# Patient Record
Sex: Female | Born: 1975 | Hispanic: No | Smoking: Never smoker
Health system: Southern US, Community
[De-identification: ages and names within clinical notes are randomized; demographics above are authoritative.]

## PROBLEM LIST (undated history)

## (undated) DIAGNOSIS — F909 Attention-deficit hyperactivity disorder, unspecified type: Secondary | ICD-10-CM

## (undated) DIAGNOSIS — F329 Major depressive disorder, single episode, unspecified: Secondary | ICD-10-CM

## (undated) DIAGNOSIS — R569 Unspecified convulsions: Secondary | ICD-10-CM

## (undated) DIAGNOSIS — F32A Depression, unspecified: Secondary | ICD-10-CM

## (undated) DIAGNOSIS — B719 Cestode infection, unspecified: Secondary | ICD-10-CM

## (undated) DIAGNOSIS — F419 Anxiety disorder, unspecified: Secondary | ICD-10-CM

## (undated) HISTORY — DX: Attention-deficit hyperactivity disorder, unspecified type: F90.9

## (undated) HISTORY — DX: Depression, unspecified: F32.A

## (undated) HISTORY — DX: Cestode infection, unspecified: B71.9

## (undated) HISTORY — PX: TONSILLECTOMY: SUR1361

## (undated) HISTORY — PX: OTHER SURGICAL HISTORY: SHX169

---
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
## 2003-05-31 ENCOUNTER — Other Ambulatory Visit: Admission: RE | Admit: 2003-05-31 | Discharge: 2003-05-31 | Payer: Self-pay | Admitting: Gynecology

## 2011-12-26 DIAGNOSIS — F909 Attention-deficit hyperactivity disorder, unspecified type: Secondary | ICD-10-CM | POA: Insufficient documentation

## 2011-12-26 DIAGNOSIS — F419 Anxiety disorder, unspecified: Secondary | ICD-10-CM | POA: Insufficient documentation

## 2013-07-27 ENCOUNTER — Encounter (HOSPITAL_COMMUNITY): Payer: Self-pay | Admitting: Emergency Medicine

## 2013-07-27 ENCOUNTER — Emergency Department (HOSPITAL_COMMUNITY): Payer: No Typology Code available for payment source

## 2013-07-27 ENCOUNTER — Emergency Department (HOSPITAL_COMMUNITY)
Admission: EM | Admit: 2013-07-27 | Discharge: 2013-07-27 | Disposition: A | Discharge status: Home / Self Care | Payer: No Typology Code available for payment source | Attending: Emergency Medicine | Admitting: Emergency Medicine

## 2013-07-27 DIAGNOSIS — F419 Anxiety disorder, unspecified: Secondary | ICD-10-CM

## 2013-07-27 DIAGNOSIS — F17200 Nicotine dependence, unspecified, uncomplicated: Secondary | ICD-10-CM | POA: Insufficient documentation

## 2013-07-27 DIAGNOSIS — R209 Unspecified disturbances of skin sensation: Secondary | ICD-10-CM | POA: Insufficient documentation

## 2013-07-27 DIAGNOSIS — Z8669 Personal history of other diseases of the nervous system and sense organs: Secondary | ICD-10-CM | POA: Insufficient documentation

## 2013-07-27 DIAGNOSIS — Z3202 Encounter for pregnancy test, result negative: Secondary | ICD-10-CM | POA: Insufficient documentation

## 2013-07-27 DIAGNOSIS — R202 Paresthesia of skin: Secondary | ICD-10-CM

## 2013-07-27 DIAGNOSIS — F411 Generalized anxiety disorder: Secondary | ICD-10-CM | POA: Insufficient documentation

## 2013-07-27 DIAGNOSIS — R109 Unspecified abdominal pain: Secondary | ICD-10-CM | POA: Insufficient documentation

## 2013-07-27 DIAGNOSIS — Z9889 Other specified postprocedural states: Secondary | ICD-10-CM | POA: Insufficient documentation

## 2013-07-27 DIAGNOSIS — R110 Nausea: Secondary | ICD-10-CM | POA: Insufficient documentation

## 2013-07-27 HISTORY — DX: Unspecified convulsions: R56.9

## 2013-07-27 LAB — COMPREHENSIVE METABOLIC PANEL
ALT: 14 U/L (ref 0–35)
BUN: 11 mg/dL (ref 6–23)
CO2: 23 mEq/L (ref 19–32)
Calcium: 9.3 mg/dL (ref 8.4–10.5)
Chloride: 104 mEq/L (ref 96–112)
Creatinine, Ser: 0.59 mg/dL (ref 0.50–1.10)
GFR calc Af Amer: >90 mL/min (ref >90)
GFR calc non Af Amer: >90 mL/min (ref >90)
Glucose, Bld: 109 mg/dL — ABNORMAL HIGH (ref 70–99)
Sodium: 140 mEq/L (ref 135–145)
Total Bilirubin: 0.5 mg/dL (ref 0.3–1.2)
Total Protein: 6.8 g/dL (ref 6.0–8.3)

## 2013-07-27 LAB — CBC WITH DIFFERENTIAL/PLATELET
Eosinophils Absolute: 0.1 10*3/uL (ref 0.0–0.7)
Eosinophils Relative: 1 % (ref 0–5)
HCT: 35.3 % — ABNORMAL LOW (ref 36.0–46.0)
Lymphocytes Relative: 35 % (ref 12–46)
Lymphs Abs: 2.1 10*3/uL (ref 0.7–4.0)
MCH: 31.7 pg (ref 26.0–34.0)
MCV: 90.3 fL (ref 78.0–100.0)
Monocytes Absolute: 0.5 10*3/uL (ref 0.1–1.0)
RBC: 3.91 MIL/uL (ref 3.87–5.11)
RDW: 12.2 % (ref 11.5–15.5)
WBC: 6.1 10*3/uL (ref 4.0–10.5)

## 2013-07-27 LAB — URINALYSIS, ROUTINE W REFLEX MICROSCOPIC
Bilirubin Urine: NEGATIVE
Hgb urine dipstick: NEGATIVE
Leukocytes, UA: NEGATIVE
Nitrite: NEGATIVE
Specific Gravity, Urine: 1.018 (ref 1.005–1.030)
Urobilinogen, UA: 0.2 mg/dL (ref 0.0–1.0)

## 2013-07-27 LAB — POCT I-STAT TROPONIN I: Troponin i, poc: 0 ng/mL (ref 0.00–0.08)

## 2013-07-27 LAB — POCT PREGNANCY, URINE: Preg Test, Ur: NEGATIVE

## 2013-07-27 MED ORDER — LORAZEPAM 2 MG/ML IJ SOLN
1.0000 mg | Freq: Once | INTRAMUSCULAR | Status: AC
  Administered 2013-07-27: 1 mg via INTRAVENOUS
  Filled 2013-07-27: qty 1

## 2013-07-27 NOTE — ED Notes (Signed)
Per EMS pt c/o left side abdominal pain/numbness as well as "respiratory issues". Per EMS pt reports swollen tongue and adenoids and thinks it's from black mold exposure. Pt is ambulatory and tearful on arrival.

## 2013-07-27 NOTE — ED Provider Notes (Signed)
CSN: [id number]     Arrival date & time 07/27/13  1537 History   First MD Initiated Contact with Patient 07/27/13 1541     Chief Complaint  Patient presents with  . multiple complaints    (Consider location/radiation/quality/duration/timing/severity/associated sxs/prior Treatment) HPI  A 37 year old female who presents with multiple complaints. Patient reports a 2 year history of shortness of breath, left-sided pain, joint pain, and eye pressure.  She also reports swollen tongue and adenoids. Her current complaint is anxiety and shortness of breath. Patient states that her chronic complaints have been related to "black mold in the house." She just recently moved. When asked what brought on her anxiety, the patient states "I was headed to the doctor it always makes me nervous." She is very tearful. Patient states that she has not taken her Xanax since Friday because she's been unable to get filled. Patient also reports having surgery at [facility] within the last year and a half when they found "a mass in the left side" and they couldn't figure out what was the cause.  Currently she endorses shortness of breath and whole body tingling/numbness. She denies headache, chest pain, urinary symptoms. Last menstrual period is current.  Past Medical History  Diagnosis Date  . Seizures     as child   Past Surgical History  Procedure Laterality Date  . Pelvic mass removal     History reviewed. No pertinent family history. History  Substance Use Topics  . Smoking status: Current Some Day Smoker    Types: Cigarettes  . Smokeless tobacco: Not on file  . Alcohol Use: Yes     Comment: occasionally   OB History   Grav Para Term Preterm Abortions TAB SAB Ect Mult Living                 Review of Systems  Constitutional: Negative for fever.  Respiratory: Positive for shortness of breath. Negative for cough and chest tightness.   Cardiovascular: Negative for chest pain.   Gastrointestinal: Positive for nausea. Negative for vomiting and abdominal pain.  Genitourinary: Positive for flank pain. Negative for dysuria.  Musculoskeletal: Negative for back pain.  Skin: Negative for wound.  Neurological: Positive for numbness. Negative for headaches.  Psychiatric/Behavioral: The patient is nervous/anxious.   All other systems reviewed and are negative.    Allergies  Lamisil  Home Medications   Current Outpatient Rx  Name  Route  Sig  Dispense  Refill  . ibuprofen (ADVIL,MOTRIN) 200 MG tablet   Oral   Take 800 mg by mouth every 6 (six) hours as needed for pain (PAIN).          BP 119/75  Pulse 90  Temp(Src) 98.6 F (37 C) (Oral)  Resp 16  SpO2 100%  LMP 07/27/2013 Physical Exam  Nursing note and vitals reviewed. Constitutional: She is oriented to person, place, and time. She appears well-developed and well-nourished.  Anxious and tearful  HENT:  Head: Normocephalic and atraumatic.  Eyes: Pupils are equal, round, and reactive to light.  Neck: Neck supple.  Cardiovascular: Normal rate, regular rhythm and normal heart sounds.   No murmur heard. Pulmonary/Chest: Effort normal and breath sounds normal. No respiratory distress. She has no wheezes.  Abdominal: Soft. Bowel sounds are normal. There is no tenderness. There is no rebound and no guarding.  Neurological: She is alert and oriented to person, place, and time. No cranial nerve deficit.  Normal strength in all 4 extremities, no dysmetria to finger-nose-finger  Skin: Skin is warm and dry.  Psychiatric: She has a normal mood and affect.    ED Course  Procedures (including critical care time) Labs Review Labs Reviewed  CBC WITH DIFFERENTIAL - Abnormal; Notable for the following:    HCT 35.3 (*)    All other components within normal limits  COMPREHENSIVE METABOLIC PANEL - Abnormal; Notable for the following:    Glucose, Bld 109 (*)    All other components within normal limits   URINALYSIS, ROUTINE W REFLEX MICROSCOPIC - Abnormal; Notable for the following:    APPearance CLOUDY (*)    pH 8.5 (*)    All other components within normal limits  POCT PREGNANCY, URINE  POCT I-STAT TROPONIN I   Imaging Review Dg Chest 2 View  07/27/2013   CLINICAL DATA:  Shortness of breath  EXAM: CHEST  2 VIEW  COMPARISON:  No comparison studies available.  FINDINGS: The heart size and mediastinal contours are within normal limits. Both lungs are clear. The visualized skeletal structures are unremarkable.  IMPRESSION: No active cardiopulmonary disease.   Electronically Signed   By: Kennith Center M.D.   On: 07/27/2013 17:39    EKG Interpretation   None      EKG independently reviewed by myself: Normal sinus rhythm with a rate of 78, no evidence of acute ST elevation or ischemia MDM   1. Anxiety   2. Tingling in extremities    This a 37 yo female who presents with multiple complaints.  Many of her complaints are chronic in nature including swollen tongue, joint pain and left-sided pain. The patient is very tearful and anxious on exam. She states "something is wrong with me."  Initial vital signs were within normal limits. Basic labwork including troponin are negative. EKG is nonischemic. Chest x-ray is negative. The patient is nonfocal on exam.  On reassessment, the patient continues to ruminate over her chronic symptoms. She is less anxious but states that I have Mold or Lyme disease.  She is also requesting that I test her for staph infection. The patient then proceeded to pull out multiple containers from her purse stating that these are the "worms I pulled out of my nose and skin."   I am unable to visualize any worms in the containers.    At this time the patient does not appear to have an emergent condition. She was treated for her anxiety and has improved. She now has her Xanax refilled. She was encouraged to followup with her primary care physician for referral to  specialist.  After history, exam, and medical workup I feel the patient has been appropriately medically screened and is safe for discharge home. Pertinent diagnoses were discussed with the patient. Patient was given return precautions.    Shon Baton, MD 07/28/13 [phone]

## 2013-07-29 ENCOUNTER — Institutional Professional Consult (permissible substitution): Payer: Self-pay | Admitting: Internal Medicine

## 2015-10-21 ENCOUNTER — Encounter (HOSPITAL_COMMUNITY): Payer: Self-pay | Admitting: Emergency Medicine

## 2015-10-21 ENCOUNTER — Emergency Department (HOSPITAL_COMMUNITY)
Admission: EM | Admit: 2015-10-21 | Discharge: 2015-10-21 | Disposition: A | Discharge status: Home / Self Care | Payer: No Typology Code available for payment source | Attending: Emergency Medicine | Admitting: Emergency Medicine

## 2015-10-21 DIAGNOSIS — Z87891 Personal history of nicotine dependence: Secondary | ICD-10-CM | POA: Diagnosis not present

## 2015-10-21 DIAGNOSIS — Z3202 Encounter for pregnancy test, result negative: Secondary | ICD-10-CM | POA: Insufficient documentation

## 2015-10-21 DIAGNOSIS — F419 Anxiety disorder, unspecified: Secondary | ICD-10-CM | POA: Diagnosis present

## 2015-10-21 HISTORY — DX: Anxiety disorder, unspecified: F41.9

## 2015-10-21 LAB — COMPREHENSIVE METABOLIC PANEL
ALBUMIN: 4.5 g/dL (ref 3.5–5.0)
ALK PHOS: 52 U/L (ref 38–126)
ALT: 12 U/L — ABNORMAL LOW (ref 14–54)
AST: 14 U/L — ABNORMAL LOW (ref 15–41)
Anion gap: 9 (ref 5–15)
BILIRUBIN TOTAL: 0.6 mg/dL (ref 0.3–1.2)
BUN: 11 mg/dL (ref 6–20)
CO2: 25 mmol/L (ref 22–32)
Calcium: 9.1 mg/dL (ref 8.9–10.3)
Chloride: 106 mmol/L (ref 101–111)
Creatinine, Ser: 0.55 mg/dL (ref 0.44–1.00)
GFR calc Af Amer: >60 mL/min (ref >60)
GFR calc non Af Amer: >60 mL/min (ref >60)
Glucose, Bld: 96 mg/dL (ref 65–99)
Potassium: 3.7 mmol/L (ref 3.5–5.1)
Sodium: 140 mmol/L (ref 135–145)
TOTAL PROTEIN: 7 g/dL (ref 6.5–8.1)

## 2015-10-21 LAB — SALICYLATE LEVEL: Salicylate Lvl: <4 mg/dL (ref 2.8–30.0)

## 2015-10-21 LAB — ETHANOL: Alcohol, Ethyl (B): <5 mg/dL (ref <5)

## 2015-10-21 LAB — CBC
HEMATOCRIT: 38.6 % (ref 36.0–46.0)
HEMOGLOBIN: 13.1 g/dL (ref 12.0–15.0)
MCH: 30.9 pg (ref 26.0–34.0)
MCHC: 33.9 g/dL (ref 30.0–36.0)
MCV: 91 fL (ref 78.0–100.0)
Platelets: 280 10*3/uL (ref 150–400)
RBC: 4.24 MIL/uL (ref 3.87–5.11)
RDW: 12.5 % (ref 11.5–15.5)
WBC: 5.9 10*3/uL (ref 4.0–10.5)

## 2015-10-21 LAB — ACETAMINOPHEN LEVEL: Acetaminophen (Tylenol), Serum: <10 ug/mL — ABNORMAL LOW (ref 10–30)

## 2015-10-21 LAB — POC URINE PREG, ED: Preg Test, Ur: NEGATIVE

## 2015-10-21 LAB — RAPID URINE DRUG SCREEN, HOSP PERFORMED
Amphetamines: NOT DETECTED
Barbiturates: NOT DETECTED
Benzodiazepines: POSITIVE — AB
COCAINE: NOT DETECTED
Opiates: NOT DETECTED
TETRAHYDROCANNABINOL: POSITIVE — AB

## 2015-10-21 MED ORDER — LORAZEPAM 1 MG PO TABS
1.0000 mg | ORAL_TABLET | Freq: Once | ORAL | Status: AC
  Administered 2015-10-21: 1 mg via ORAL
  Filled 2015-10-21: qty 1

## 2015-10-21 NOTE — BH Assessment (Signed)
Assessment completed. Consulted Teresa FessIjeoma Nwaeze, NP who agrees that pt does not meet inpatient criteria. Informed Dr. Rubin PayorPickering of the recommendation. Pt has requested to be discharged.

## 2015-10-21 NOTE — ED Notes (Addendum)
Pt. Requesting to be discharged. Since pt. Here voluntarily Dr. Rubin PayorPickering authorizing discharge.

## 2015-10-21 NOTE — Discharge Instructions (Signed)

## 2015-10-21 NOTE — ED Notes (Signed)
Pt. To [facility] from ED ambulatory without difficulty, to room 34 . [facility]. Pt. Is alert and oriented, warm and dry in no distress. Pt. Denies SI, HI, and AVH. Pt. Calm and cooperative. Pt. Made aware of security cameras and Q15 minute rounds. Pt. Encouraged to let Nursing staff know of any concerns or needs.

## 2015-10-21 NOTE — ED Notes (Addendum)
Pt transported from home with c/o anxiety, per EMS pt hyperventilating, out of valium, hx of anxiety. Per EMS pt recently had a friend die, feeling anxious d/t this.  Pt has multiple attempts to contact provider for refill, pt states she has T & A November 1st, has been having low temps, intermittent rash(none noted at this time), pt also c/o pain beside L eye.  Pt has multiple photos on telephone she would like to share with provider.

## 2015-10-21 NOTE — ED Notes (Signed)
Pt. Discharged in scrubs with discharge instructions and bagged belongings at her request.
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
## 2015-10-21 NOTE — ED Provider Notes (Signed)
CSN: [id number]     Arrival date & time 10/21/15  2039 History   First MD Initiated Contact with Patient 10/21/15 2116     Chief Complaint  Patient presents with  . Anxiety      Patient is a 40 y.o. female presenting with anxiety. The history is provided by the patient.  Anxiety Pertinent negatives include no chest pain, no abdominal pain and no shortness of breath.   patient presents with anxiety. She states that around a month ago she took her primary doctor that she wanted to switch from Xanax to Valium so it lasts longer. She states it is not working. She states she is not sure that they didn't give her a placebo. She states that also she has fullness in the left side of her face that centers her anxiety. States she's had her tonsils out and did not help. She states she's had a spot there for the last 2 years. She states her anxiety is been getting worse. She states that she needs to get started back on her Xanax) nurse will not tell the doctor to fill it. Patient states she needs to get her Xanax back or she doesn't know what she will do. She states she is not suicidal. Past Medical History  Diagnosis Date  . Seizures (HCC)     as child  . Anxiety    Past Surgical History  Procedure Laterality Date  . Pelvic mass removal    . Tonsillectomy     No family history on file. Social History  Substance Use Topics  . Smoking status: Former Smoker    Types: Cigarettes  . Smokeless tobacco: None  . Alcohol Use: Yes     Comment: occasionally   OB History    No data available     Review of Systems  Constitutional: Negative for appetite change.  Respiratory: Negative for shortness of breath.   Cardiovascular: Negative for chest pain.  Gastrointestinal: Negative for abdominal pain.  Genitourinary: Negative for dysuria.  Musculoskeletal: Negative for back pain.  Skin: Negative for wound.  Neurological: Negative for numbness.  Hematological: Negative for adenopathy.   Psychiatric/Behavioral: Negative for suicidal ideas.      Allergies  Lamisil  Home Medications   Prior to Admission medications   Medication Sig Start Date End Date Taking? Authorizing Provider  ALPRAZolam Prudy Feeler(XANAX) 0.5 MG tablet TK 1 T PO  BID 08/31/15  Yes Historical Provider, MD  ibuprofen (ADVIL,MOTRIN) 200 MG tablet Take 800 mg by mouth every 6 (six) hours as needed for pain (PAIN).    Historical Provider, MD   BP 123/77 mmHg  Pulse 63  Temp(Src) 98 F (36.7 C) (Oral)  Resp 18  Ht 5\' 6"  (1.676 m)  Wt 125 lb (56.7 kg)  BMI 20.19 kg/m2  SpO2 100%  LMP 10/14/2015 Physical Exam  Constitutional: She appears well-developed.  HENT:  Head: Atraumatic.  Eyes: EOM are normal.  Neck: Neck supple.  Cardiovascular: Normal rate.   Pulmonary/Chest: Effort normal.  Abdominal: Soft.  Musculoskeletal: Normal range of motion.  Neurological: She is alert.  Skin: Skin is warm.  Psychiatric:  Patient appears anxious.    ED Course  Procedures (including critical care time) Labs Review Labs Reviewed  COMPREHENSIVE METABOLIC PANEL - Abnormal; Notable for the following:    AST 14 (*)    ALT 12 (*)    All other components within normal limits  ACETAMINOPHEN LEVEL - Abnormal; Notable for the following:    Acetaminophen (Tylenol),  Serum <10 (*)    All other components within normal limits  URINE RAPID DRUG SCREEN, HOSP PERFORMED - Abnormal; Notable for the following:    Benzodiazepines POSITIVE (*)    Tetrahydrocannabinol POSITIVE (*)    All other components within normal limits  ETHANOL  SALICYLATE LEVEL  CBC  POC URINE PREG, ED    Imaging Review No results found. I have personally reviewed and evaluated these images and lab results as part of my medical decision-making.   EKG Interpretation None      MDM   Final diagnoses:  Anxiety    Patient presents with anxiety. She's been out of her Valium. Does appear to been started on a rather low-dose of Valium  But she  was not using it correctly and gave her more than the prescribed doses. I told her I would not refill the medicine since she should still have pills left over. She will need follow-up with primary doctor. She had told me that she was real anxious about it didn't know what she would do. She did not appear suicidal or homicidal. She received oral Ativan here. Appears medically cleared at that point she was no longer willing to stay and left. Do not know if she got the discharge instructions or not.   Benjiman Core, MD 10/22/15 0000

## 2016-10-22 ENCOUNTER — Ambulatory Visit (HOSPITAL_COMMUNITY): Payer: No Typology Code available for payment source | Admitting: Psychiatry

## 2016-11-19 ENCOUNTER — Ambulatory Visit (HOSPITAL_COMMUNITY): Payer: Self-pay | Admitting: Psychiatry

## 2016-11-22 ENCOUNTER — Encounter (INDEPENDENT_AMBULATORY_CARE_PROVIDER_SITE_OTHER): Payer: Self-pay

## 2016-11-22 ENCOUNTER — Encounter (HOSPITAL_COMMUNITY): Payer: Self-pay | Admitting: Psychiatry

## 2016-11-22 ENCOUNTER — Ambulatory Visit (INDEPENDENT_AMBULATORY_CARE_PROVIDER_SITE_OTHER): Payer: No Typology Code available for payment source | Admitting: Psychiatry

## 2016-11-22 VITALS — BP 122/74 | HR 72 | Ht 66.0 in | Wt 144.6 lb

## 2016-11-22 DIAGNOSIS — Z79899 Other long term (current) drug therapy: Secondary | ICD-10-CM

## 2016-11-22 DIAGNOSIS — Z818 Family history of other mental and behavioral disorders: Secondary | ICD-10-CM | POA: Diagnosis not present

## 2016-11-22 DIAGNOSIS — F419 Anxiety disorder, unspecified: Secondary | ICD-10-CM | POA: Diagnosis not present

## 2016-11-22 DIAGNOSIS — F4310 Post-traumatic stress disorder, unspecified: Secondary | ICD-10-CM

## 2016-11-22 DIAGNOSIS — F22 Delusional disorders: Secondary | ICD-10-CM

## 2016-11-22 NOTE — Patient Instructions (Signed)
RTC in 2 weeks
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
## 2016-12-06 ENCOUNTER — Ambulatory Visit (INDEPENDENT_AMBULATORY_CARE_PROVIDER_SITE_OTHER): Payer: No Typology Code available for payment source | Admitting: Psychiatry

## 2016-12-06 ENCOUNTER — Encounter (HOSPITAL_COMMUNITY): Payer: Self-pay | Admitting: Psychiatry

## 2016-12-06 VITALS — BP 118/68 | HR 81 | Ht 66.0 in | Wt 141.2 lb

## 2016-12-06 DIAGNOSIS — F410 Panic disorder [episodic paroxysmal anxiety] without agoraphobia: Secondary | ICD-10-CM | POA: Diagnosis not present

## 2016-12-06 DIAGNOSIS — F331 Major depressive disorder, recurrent, moderate: Secondary | ICD-10-CM

## 2016-12-06 DIAGNOSIS — Z79899 Other long term (current) drug therapy: Secondary | ICD-10-CM | POA: Diagnosis not present

## 2016-12-06 MED ORDER — ESCITALOPRAM OXALATE 10 MG PO TABS: 10.0000 mg | ORAL_TABLET | Freq: Every day | ORAL | 1 refills | Status: DC

## 2016-12-06 MED ORDER — ALPRAZOLAM 0.5 MG PO TABS: 0.5000 mg | ORAL_TABLET | Freq: Three times a day (TID) | ORAL | 0 refills | Status: DC | PRN

## 2016-12-06 NOTE — Progress Notes (Signed)
BH MD/PA/NP OP Progress Note  12/06/2016 1:50 PM Teresa Hensley  MRN:  [id number]  Chief Complaint:  Subjective:  Teresa Hensley presents today for follow-up of depression and anxiety symptoms. She continues to report ruminative symptoms with regard to her past medical issues. We spent time discussing how her rumination, and worries about future medical problems have affected her social life, and interpersonal relationships. She reports that she has been talking a lot with her mother about SSRI therapy. She reports that her mom recently started Lexapro, for her own struggles with anxiety and depression. She reports that mom has had a really good response to Lexapro, and this is made the patient more open to the idea of SSRI therapy.  I spent time learning about her day-to-day life, habits, and some of the behavioral modification she has been making to address her anxiety and mood symptoms. She continues to report that she has panic episodes throughout the day 2-3 times daily. Spent time discussing my recommendation that we start Lexapro 10 mg daily, and see if this provides her with a similar benefit as has been reported by her mother.  She denies any SI, and reports that her sleep has been okay. She agrees to start Lexapro, and we reviewed the risks and benefits of this therapy.  Visit Diagnosis:    ICD-9-CM ICD-10-CM   1. MDD (major depressive disorder), recurrent episode, moderate (HCC) 296.32 F33.1 escitalopram (LEXAPRO) 10 MG tablet     DISCONTINUED: escitalopram (LEXAPRO) 10 MG tablet    Past Psychiatric History: See intake H&P for full details. Reviewed, with no updates at this time.   Past Medical History:  Past Medical History:  Diagnosis Date  . Anxiety   . Seizures (HCC)    as child    Past Surgical History:  Procedure Laterality Date  . pelvic mass removal    . TONSILLECTOMY      Family Psychiatric History: See intake H&P for full details. Reviewed, with no updates at this  time.   Family History: History reviewed. No pertinent family history.  Social History:  Social History   Social History  . Marital status: Single    Spouse name: N/A  . Number of children: N/A  . Years of education: N/A   Social History Main Topics  . Smoking status: Never Smoker  . Smokeless tobacco: Never Used  . Alcohol use Yes     Comment: occasionally  . Drug use: No  . Sexual activity: Not Asked   Other Topics Concern  . None   Social History Narrative  . None    Allergies:  Allergies  Allergen Reactions  . Lamisil [Terbinafine]     " JUST CRAZINESS"     Metabolic Disorder Labs: No results found for: HGBA1C, MPG No results found for: PROLACTIN No results found for: CHOL, TRIG, HDL, CHOLHDL, VLDL, LDLCALC   Current Medications: Current Outpatient Prescriptions  Medication Sig Dispense Refill  . ALPRAZolam (XANAX) 0.5 MG tablet Take 1 tablet (0.5 mg total) by mouth 3 (three) times daily as needed for sleep or anxiety. 90 tablet 0  . escitalopram (LEXAPRO) 10 MG tablet Take 1 tablet (10 mg total) by mouth daily. 60 tablet 1  . ibuprofen (ADVIL,MOTRIN) 200 MG tablet Take 800 mg by mouth every 6 (six) hours as needed for pain (PAIN).     No current facility-administered medications for this visit.     Neurologic: Headache: Negative Seizure: Negative Paresthesias: Negative  Musculoskeletal: Strength & Muscle Tone: within  normal limits Gait & Station: normal Patient leans: N/A  Psychiatric Specialty Exam: ROS  Blood pressure 118/68, pulse 81, height 5\' 6"  (1.676 m), weight 64 kg (141 lb 3.2 oz).Body mass index is 22.79 kg/m.  General Appearance: Well Groomed  Eye Contact:  Fair  Speech:  Clear and Coherent  Volume:  Normal  Mood:  Depressed  Affect:  Tearful  Thought Process:  Coherent  Orientation:  Full (Time, Place, and Person)  Thought Content: Logical   Suicidal Thoughts:  No  Homicidal Thoughts:  No  Memory:  Immediate;   Good   Judgement:  Fair  Insight:  Fair  Psychomotor Activity:  Normal  Concentration:  Concentration: Good and Attention Span: Good  Recall:  NA  Fund of Knowledge: Good  Language: Good  Akathisia:  Negative  Handed:  Right  AIMS (if indicated):  n/a  Assets:  Communication Skills Desire for Improvement Financial Resources/Insurance Housing Intimacy Leisure Time Physical Health Resilience Social Support Talents/Skills Transportation Vocational/Educational  ADL's:  Intact  Cognition: WNL  Sleep:  6-7 hours nightly    Treatment Plan Summary:  Teresa PassyWendy Hensley is a 41 year old female with a history of parasitosis of unclear etiology, with general remission of her medical symptoms in the past 6 months to one year. Her medical issues have contributed to significant depressive and anxiety symptoms, and she has become preoccupied with the distress that his contributed to her personal and social life. She is also been contending with the loss of a friend, who ended up committing suicide a few years ago. She presents with tearfulness, down and depressed mood, sense of guilt and worthlessness, and anhedonia. Her mother has had a good response to Lexapro, and the patient is willing to give this medication a trial. We'll continue as below and follow up in 1 month.  # MDD with anxious features, panic attacks - Lexapro 10 mg daily for 1 week, then increase to 20 mg daily - Xanax 0.5 mg TID, goal to taper and discontinue as SSRI takes effect - RTC 1 month   Burnard LeighAlexander Arya Leler Brion, MD 12/06/2016, 1:50 PM

## 2016-12-26 ENCOUNTER — Other Ambulatory Visit (HOSPITAL_COMMUNITY): Payer: Self-pay

## 2016-12-26 DIAGNOSIS — F331 Major depressive disorder, recurrent, moderate: Secondary | ICD-10-CM

## 2016-12-26 MED ORDER — ESCITALOPRAM OXALATE 20 MG PO TABS: 20.0000 mg | ORAL_TABLET | Freq: Every day | ORAL | 1 refills | Status: DC

## 2016-12-26 NOTE — Progress Notes (Signed)
Excellent, thank you for the update.

## 2016-12-26 NOTE — Progress Notes (Unsigned)
Patient called, she had started on Lexapro at her last visit. She was started on 10 mg and then increased to 20 mg. The prescription was written for 1 a day # 60 with a refill. The pharmacy only gave the patient 30 tablets (they should have called me for clarification...) and they told her it was to early for another refill, so I sent in a new rx for 20 mg 1 po qd # 30 x1.

## 2017-01-10 ENCOUNTER — Ambulatory Visit (HOSPITAL_COMMUNITY): Payer: Self-pay | Admitting: Psychiatry

## 2017-01-10 ENCOUNTER — Ambulatory Visit (INDEPENDENT_AMBULATORY_CARE_PROVIDER_SITE_OTHER): Payer: No Typology Code available for payment source | Admitting: Psychiatry

## 2017-01-10 VITALS — BP 116/76 | HR 128 | Ht 66.0 in | Wt 135.0 lb

## 2017-01-10 DIAGNOSIS — F22 Delusional disorders: Secondary | ICD-10-CM

## 2017-01-10 DIAGNOSIS — F419 Anxiety disorder, unspecified: Secondary | ICD-10-CM | POA: Diagnosis not present

## 2017-01-10 DIAGNOSIS — F4310 Post-traumatic stress disorder, unspecified: Secondary | ICD-10-CM

## 2017-01-10 DIAGNOSIS — Z79899 Other long term (current) drug therapy: Secondary | ICD-10-CM

## 2017-01-10 DIAGNOSIS — F331 Major depressive disorder, recurrent, moderate: Secondary | ICD-10-CM | POA: Diagnosis not present

## 2017-01-10 MED ORDER — ALPRAZOLAM 0.5 MG PO TABS: 0.5000 mg | ORAL_TABLET | Freq: Three times a day (TID) | ORAL | 0 refills | Status: DC | PRN

## 2017-01-10 MED ORDER — SERTRALINE HCL 50 MG PO TABS: 50.0000 mg | ORAL_TABLET | Freq: Every day | ORAL | 2 refills | Status: DC
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
## 2017-01-10 NOTE — Patient Instructions (Signed)
STOP lexapro  START Zoloft 25 mg for 1 week, then increase to 50 mg  Xanax 3 times daily prn

## 2017-02-11 ENCOUNTER — Other Ambulatory Visit (HOSPITAL_COMMUNITY): Payer: Self-pay

## 2017-02-11 NOTE — Telephone Encounter (Signed)
Medication management - Telephone call with pt. to follow up on message she left with request for a refill of her prescribed Xanax and requests this order be called into [facility] on [address] in [place].  Informed Dr. Rene KocherEksir is off this week and request will be sent to Dr. Ladona Ridgelaylor who will be in the office on 02/12/17 to see if he will provide the refill order and reminded patient of next evaluation set for 02/25/17 with Dr. Rene KocherEksir.

## 2017-02-12 ENCOUNTER — Other Ambulatory Visit (HOSPITAL_COMMUNITY): Payer: Self-pay

## 2017-02-12 DIAGNOSIS — F4310 Post-traumatic stress disorder, unspecified: Secondary | ICD-10-CM

## 2017-02-12 DIAGNOSIS — F331 Major depressive disorder, recurrent, moderate: Secondary | ICD-10-CM

## 2017-02-12 DIAGNOSIS — F419 Anxiety disorder, unspecified: Secondary | ICD-10-CM

## 2017-02-12 MED ORDER — ALPRAZOLAM 0.5 MG PO TABS: 0.5000 mg | ORAL_TABLET | Freq: Three times a day (TID) | ORAL | 0 refills | Status: DC | PRN

## 2017-02-12 NOTE — Telephone Encounter (Signed)
Spoke with Dr. Ladona Ridgelaylor and per protocol a 14 day prescription was sent to pharmacy - this will get patient to her appointment on 5/21. Patient is aware.

## 2017-02-25 ENCOUNTER — Ambulatory Visit (HOSPITAL_COMMUNITY): Payer: Self-pay | Admitting: Psychiatry

## 2017-03-01 ENCOUNTER — Telehealth (HOSPITAL_COMMUNITY): Payer: Self-pay

## 2017-03-01 DIAGNOSIS — F4310 Post-traumatic stress disorder, unspecified: Secondary | ICD-10-CM

## 2017-03-01 DIAGNOSIS — F331 Major depressive disorder, recurrent, moderate: Secondary | ICD-10-CM

## 2017-03-01 DIAGNOSIS — F419 Anxiety disorder, unspecified: Secondary | ICD-10-CM

## 2017-03-01 NOTE — Telephone Encounter (Signed)
Patient called and said that she had to be rescheduled and will not have enough Xanax until her appointment, okay to fill? Please review and advise, thank you

## 2017-03-01 NOTE — Telephone Encounter (Signed)
Looks like Dr. Ladona Ridgelaylor sent in 45 tablets on 5/8.  We can send in a refill of 60 tablets (0.5 mg BID prn) and she should be gradually decreasing her use of xanax, as she and I have discussed multiple times, so we can emphasize antidepressant medications instead

## 2017-03-05 MED ORDER — ALPRAZOLAM 0.5 MG PO TABS: 0.5000 mg | ORAL_TABLET | Freq: Two times a day (BID) | ORAL | 0 refills | Status: AC | PRN

## 2017-03-05 NOTE — Telephone Encounter (Signed)
Called in the 60 tablets, I called patient and left her a voicemail letting her know

## 2017-03-07 ENCOUNTER — Telehealth (HOSPITAL_COMMUNITY): Payer: Self-pay

## 2017-03-07 DIAGNOSIS — F331 Major depressive disorder, recurrent, moderate: Secondary | ICD-10-CM

## 2017-03-07 DIAGNOSIS — F419 Anxiety disorder, unspecified: Secondary | ICD-10-CM

## 2017-03-07 DIAGNOSIS — F4310 Post-traumatic stress disorder, unspecified: Secondary | ICD-10-CM

## 2017-03-07 NOTE — Telephone Encounter (Signed)
Medication refill request - [facility] is requesting a new order for 90 day supply of sertraline in place of current 30 day orders.

## 2017-03-08 NOTE — Telephone Encounter (Signed)
Oh yes that is fine

## 2017-03-11 MED ORDER — SERTRALINE HCL 50 MG PO TABS: 50.0000 mg | ORAL_TABLET | Freq: Every day | ORAL | 0 refills | Status: DC

## 2017-03-11 NOTE — Telephone Encounter (Signed)
New 90 day order for patient's prescribed Sertraline e-scribed to [facility]'s [facility], [phone]#[id number] as verbally approved by Dr. Rene KocherEksir.

## 2017-03-19 ENCOUNTER — Ambulatory Visit (HOSPITAL_COMMUNITY): Payer: Self-pay | Admitting: Psychiatry

## 2018-08-07 ENCOUNTER — Encounter (HOSPITAL_COMMUNITY): Payer: Self-pay | Admitting: Psychiatry

## 2018-08-07 ENCOUNTER — Ambulatory Visit (INDEPENDENT_AMBULATORY_CARE_PROVIDER_SITE_OTHER): Payer: No Typology Code available for payment source | Admitting: Psychiatry

## 2018-08-07 ENCOUNTER — Encounter

## 2018-08-07 VITALS — BP 127/96 | HR 105 | Ht 66.0 in | Wt 140.0 lb

## 2018-08-07 DIAGNOSIS — F331 Major depressive disorder, recurrent, moderate: Secondary | ICD-10-CM | POA: Diagnosis not present

## 2018-08-07 DIAGNOSIS — F411 Generalized anxiety disorder: Secondary | ICD-10-CM | POA: Diagnosis not present

## 2018-08-07 MED ORDER — ALPRAZOLAM 0.5 MG PO TABS: 0.5000 mg | ORAL_TABLET | Freq: Every evening | ORAL | 1 refills | Status: DC | PRN

## 2018-08-07 MED ORDER — ESCITALOPRAM OXALATE 10 MG PO TABS: 10.0000 mg | ORAL_TABLET | Freq: Every day | ORAL | 1 refills | Status: DC

## 2018-08-07 NOTE — Progress Notes (Signed)
BH MD/PA/NP OP Progress Note  10/18/2018 1:23 PM Teresa Hensley  MRN:  [id number]  Chief Complaint:  Chief Complaint    Anxiety; Depression     Subjective: I am meeting the patient for the first time today.  She was previously treated by Dr. Rene Kocher on last seen in April 2018. She saw Dr. Rene Kocher 3 times.   Patient presents today with her mother.  Per record review I see that she has history of anxiety and childhood seizures.  Today she tells me that she is very anxious and depressed.  She has been suffering from mold and parasitic infections all over her body with various symptoms for the last 5 years at least.  She has thought treatment from multiple doctors and has moved at least 3-4 times over the last few years believing that her apartment had mold which was causing her symptoms.  Due to this ongoing anxiety in somatic complaints she has stopped working and lost many friends.  Because of her ongoing health problems she is having extreme anxiety.  She talks of poor sleep, ruminations and racing thoughts, fear over her future and health.  Every physical issue becomes extreme.  She has been taking Xanax and states that it does help to control some of her extreme anxiety.  This is leading to her depression as she feels like she is not getting any help and that no one understands.  She has low motivation and low energy and anhedonia.  She denies SI/HI.  She denies auditory and visual hallucinations and ideas of reference.  She does tell me that she was diagnosed with ADHD after college and was treated with Adderall.  She believes that that would be helpful.  She tells me that she has tried many different antidepressants in the past and has never had a good experience with them.  At this point she is hesitant to try anything except maybe Lexapro.          Visit Diagnosis:    ICD-10-CM   1. GAD (generalized anxiety disorder) F41.1   2. MDD (major depressive disorder), recurrent episode, moderate  (HCC) F33.1        Past Psychiatric History: Per record review and patient history Patient has depression and anxiety symptoms related to her physical illnesses. ADHD dx in childhood- Adderall XR very effective, Vyvanse- not effective. Lexapro ineffective and weight gain.  Patient has been to numerous outpatient psychiatrist and primary care doctors for mental health treatment.  She denies any history of inpatient psychiatric admissions.  She is tried numerous medications and is generally hesitant to make any changes.  Past Medical History:  Past Medical History:  Diagnosis Date  . ADHD (attention deficit hyperactivity disorder)   . Anxiety   . Seizures (HCC)    as child    Past Surgical History:  Procedure Laterality Date  . pelvic mass removal    . TONSILLECTOMY      Family Psychiatric History: Patient reports a family history of OCD   Family History: No family history on file.  Social History:  Social History   Socioeconomic History  . Marital status: Single    Spouse name: Not on file  . Number of children: 0  . Years of education: Not on file  . Highest education level: Bachelor's degree (e.g., BA, AB, BS)  Occupational History  . Not on file  Social Needs  . Financial resource strain: Not hard at all  . Food insecurity:    Worry:  Never true    Inability: Never true  . Transportation needs:    Medical: No    Non-medical: No  Tobacco Use  . Smoking status: Never Smoker  . Smokeless tobacco: Never Used  Substance and Sexual Activity  . Alcohol use: Yes    Comment: occasionally  . Drug use: No  . Sexual activity: Not Currently  Lifestyle  . Physical activity:    Days per week: 5 days    Minutes per session: 60 min  . Stress: Rather much  Relationships  . Social connections:    Talks on phone: More than three times a week    Gets together: More than three times a week    Attends religious service: Not on file    Active member of club or organization:  No    Attends meetings of clubs or organizations: Never    Relationship status: Never married  Other Topics Concern  . Not on file  Social History Narrative  . Not on file    Allergies:  Allergies  Allergen Reactions  . Tramadol Itching  . Codeine Itching and Nausea Only  . Lamisil [Terbinafine]     " JUST CRAZINESS"     Metabolic Disorder Labs: Lab Results  Component Value Date   HGBA1C 4.3 (L) 09/10/2018   MPG 76.71 09/10/2018   No results found for: PROLACTIN No results found for: CHOL, TRIG, HDL, CHOLHDL, VLDL, LDLCALC   Current Medications: Current Outpatient Medications  Medication Sig Dispense Refill  . ADDERALL XR 15 MG 24 hr capsule Take 1 capsule by mouth daily. 30 capsule 0  . ALPRAZolam (XANAX) 0.5 MG tablet Take 1 tablet (0.5 mg total) by mouth at bedtime as needed for anxiety. 30 tablet 1  . amphetamine-dextroamphetamine (ADDERALL XR) 15 MG 24 hr capsule Take 1 capsule by mouth daily. 30 capsule 0  . famotidine (PEPCID) 40 MG tablet Take 1 tablet (40 mg total) by mouth 2 (two) times daily. 30 tablet 0  . pantoprazole (PROTONIX) 40 MG tablet Take 1 tablet (40 mg total) by mouth daily. 30 tablet 0   No current facility-administered medications for this visit.      Musculoskeletal: Strength & Muscle Tone: within normal limits Gait & Station: normal Patient leans: N/A  Psychiatric Specialty Exam: Review of Systems  Constitutional: Negative for chills, diaphoresis and fever.  HENT: Negative for congestion, nosebleeds, sinus pain and sore throat.     Blood pressure (!) 127/96, pulse (!) 105, height 5\' 6"  (1.676 m), weight 140 lb (63.5 kg), SpO2 100 %.Body mass index is 22.6 kg/m.  General Appearance: Fairly Groomed  Eye Contact:  Good  Speech:  pushed  Volume:  Normal  Mood:  Anxious and Depressed  Affect:  Labile and Tearful  Thought Process:  Coherent and Descriptions of Associations: Circumstantial  Orientation:  Full (Time, Place, and Person)   Thought Content:  Rumination  Suicidal Thoughts:  No  Homicidal Thoughts:  No  Memory:  Immediate;   Good  Judgement:  Poor  Insight:  Shallow  Psychomotor Activity:  Normal  Concentration:  Concentration: Poor  Recall:  Good  Fund of Knowledge:  Good  Language:  Good  Akathisia:  No  Handed:  Right  AIMS (if indicated):     Assets:  Desire for Improvement Social Support Talents/Skills Transportation  ADL's:  Intact  Cognition:  WNL  Sleep:   poor     1. GAD (generalized anxiety disorder)   2. MDD (major depressive  disorder), recurrent episode, moderate (HCC)    - Xanax 0.5 mg TID, goal to taper and discontinue as SSRI takes effect      Assessment: GAD; MDD-recurrent, moderate; r/o ADHD  I reviewed the medical records   Medication management with supportive therapy. Risks and benefits, side effects and alternative treatment options discussed with patient. Pt was given an opportunity to ask questions about medication, illness, and treatment. All current psychiatric medications have been reviewed and discussed with the patient and adjusted as clinically appropriate. The patient has been provided an accurate and updated list of the medications being now prescribed. Pt verbalized understanding and verbal consent obtained for treatment.  The risk of un-intended pregnancy is high based on the fact that pt reports she is not using any conceptive. Pt is aware that these meds carry a teratogenic risk. Pt will discuss plan of action if she does or plans to become pregnant in the future.  Status of current problems: anxiety and depression are severe  Meds: d/c Zoloft Lexapro 10mg  po qD (started one month ago by PCP) Xanax 0.5mg  qD prn   Labs: none  Therapy: brief supportive therapy provided. Discussed psychosocial stressors in detail.      Consultations: Referred for therapy Encouraged to follow up with PCP as needed    SI and is at an acute low risk for suicide.  Patient told to call clinic if any problems occur. Patient advised to go to ER if they should develop SI/HI, side effects, or if symptoms worsen. Pt has crisis numbers to call if needed. Pt acknowledged and agreed with plan and verbalized understanding.  F/up in 1 months or sooner if needed  The duration of this appointment visit was 45 minutes of face-to-face time with the patient.  Greater than 50% of this time was spent in counseling, explanation of  diagnosis, planning of further management, and coordination of care    Oletta Darter, MD 10/18/2018, 1:23 PM

## 2018-09-10 ENCOUNTER — Emergency Department (HOSPITAL_COMMUNITY): Payer: No Typology Code available for payment source

## 2018-09-10 ENCOUNTER — Encounter (HOSPITAL_COMMUNITY): Payer: Self-pay | Admitting: *Deleted

## 2018-09-10 ENCOUNTER — Inpatient Hospital Stay (HOSPITAL_COMMUNITY)
Admission: EM | Admit: 2018-09-10 | Discharge: 2018-09-11 | DRG: 641 | Disposition: A | Discharge status: Home / Self Care | Payer: No Typology Code available for payment source | Attending: Family Medicine | Admitting: Family Medicine

## 2018-09-10 ENCOUNTER — Inpatient Hospital Stay (HOSPITAL_COMMUNITY): Payer: No Typology Code available for payment source

## 2018-09-10 ENCOUNTER — Other Ambulatory Visit (HOSPITAL_COMMUNITY): Payer: Self-pay

## 2018-09-10 ENCOUNTER — Other Ambulatory Visit: Payer: Self-pay

## 2018-09-10 DIAGNOSIS — F419 Anxiety disorder, unspecified: Secondary | ICD-10-CM | POA: Diagnosis present

## 2018-09-10 DIAGNOSIS — E8729 Other acidosis: Secondary | ICD-10-CM | POA: Diagnosis present

## 2018-09-10 DIAGNOSIS — R112 Nausea with vomiting, unspecified: Secondary | ICD-10-CM

## 2018-09-10 DIAGNOSIS — Z79899 Other long term (current) drug therapy: Secondary | ICD-10-CM | POA: Diagnosis not present

## 2018-09-10 DIAGNOSIS — T730XXA Starvation, initial encounter: Secondary | ICD-10-CM | POA: Diagnosis present

## 2018-09-10 DIAGNOSIS — Z888 Allergy status to other drugs, medicaments and biological substances status: Secondary | ICD-10-CM

## 2018-09-10 DIAGNOSIS — E872 Acidosis, unspecified: Secondary | ICD-10-CM | POA: Diagnosis present

## 2018-09-10 DIAGNOSIS — F329 Major depressive disorder, single episode, unspecified: Secondary | ICD-10-CM | POA: Diagnosis present

## 2018-09-10 DIAGNOSIS — N179 Acute kidney failure, unspecified: Secondary | ICD-10-CM

## 2018-09-10 DIAGNOSIS — F909 Attention-deficit hyperactivity disorder, unspecified type: Secondary | ICD-10-CM | POA: Diagnosis present

## 2018-09-10 DIAGNOSIS — R748 Abnormal levels of other serum enzymes: Secondary | ICD-10-CM

## 2018-09-10 DIAGNOSIS — Z885 Allergy status to narcotic agent status: Secondary | ICD-10-CM

## 2018-09-10 DIAGNOSIS — R1013 Epigastric pain: Secondary | ICD-10-CM

## 2018-09-10 DIAGNOSIS — G4700 Insomnia, unspecified: Secondary | ICD-10-CM | POA: Diagnosis present

## 2018-09-10 DIAGNOSIS — F99 Mental disorder, not otherwise specified: Secondary | ICD-10-CM

## 2018-09-10 DIAGNOSIS — R7989 Other specified abnormal findings of blood chemistry: Secondary | ICD-10-CM

## 2018-09-10 DIAGNOSIS — R945 Abnormal results of liver function studies: Secondary | ICD-10-CM

## 2018-09-10 LAB — COMPREHENSIVE METABOLIC PANEL
ALBUMIN: 4.8 g/dL (ref 3.5–5.0)
ALK PHOS: 54 U/L (ref 38–126)
ALT: 56 U/L — ABNORMAL HIGH (ref 0–44)
AST: 38 U/L (ref 15–41)
BILIRUBIN TOTAL: 1.6 mg/dL — AB (ref 0.3–1.2)
BUN: 9 mg/dL (ref 6–20)
CALCIUM: 8.7 mg/dL — AB (ref 8.9–10.3)
CO2: <7 mmol/L — ABNORMAL LOW (ref 22–32)
CREATININE: 1.57 mg/dL — AB (ref 0.44–1.00)
Chloride: 104 mmol/L (ref 98–111)
GFR calc Af Amer: 47 mL/min — ABNORMAL LOW (ref >60)
GFR calc non Af Amer: 41 mL/min — ABNORMAL LOW (ref >60)
GLUCOSE: 244 mg/dL — AB (ref 70–99)
Potassium: 5.2 mmol/L — ABNORMAL HIGH (ref 3.5–5.1)
Sodium: 137 mmol/L (ref 135–145)
TOTAL PROTEIN: 7.9 g/dL (ref 6.5–8.1)

## 2018-09-10 LAB — BASIC METABOLIC PANEL
Anion gap: 21 — ABNORMAL HIGH (ref 5–15)
Anion gap: 15 (ref 5–15)
Anion gap: 13 (ref 5–15)
Anion gap: 11 (ref 5–15)
BUN: 8 mg/dL (ref 6–20)
BUN: 9 mg/dL (ref 6–20)
BUN: 5 mg/dL — ABNORMAL LOW (ref 6–20)
BUN: <5 mg/dL — ABNORMAL LOW (ref 6–20)
CALCIUM: 8.7 mg/dL — AB (ref 8.9–10.3)
CO2: 8 mmol/L — ABNORMAL LOW (ref 22–32)
CO2: 10 mmol/L — ABNORMAL LOW (ref 22–32)
CO2: 18 mmol/L — AB (ref 22–32)
CO2: 22 mmol/L (ref 22–32)
CREATININE: 1.34 mg/dL — AB (ref 0.44–1.00)
Calcium: 8.4 mg/dL — ABNORMAL LOW (ref 8.9–10.3)
Calcium: 8.5 mg/dL — ABNORMAL LOW (ref 8.9–10.3)
Calcium: 8.4 mg/dL — ABNORMAL LOW (ref 8.9–10.3)
Chloride: 108 mmol/L (ref 98–111)
Chloride: 110 mmol/L (ref 98–111)
Chloride: 105 mmol/L (ref 98–111)
Chloride: 104 mmol/L (ref 98–111)
Creatinine, Ser: 1.1 mg/dL — ABNORMAL HIGH (ref 0.44–1.00)
Creatinine, Ser: 0.95 mg/dL (ref 0.44–1.00)
Creatinine, Ser: 0.89 mg/dL (ref 0.44–1.00)
GFR calc Af Amer: 57 mL/min — ABNORMAL LOW (ref >60)
GFR calc Af Amer: >60 mL/min (ref >60)
GFR calc Af Amer: >60 mL/min (ref >60)
GFR calc Af Amer: >60 mL/min (ref >60)
GFR calc non Af Amer: >60 mL/min (ref >60)
GFR calc non Af Amer: >60 mL/min (ref >60)
GFR calc non Af Amer: >60 mL/min (ref >60)
GFR, EST NON AFRICAN AMERICAN: 49 mL/min — AB (ref >60)
Glucose, Bld: 214 mg/dL — ABNORMAL HIGH (ref 70–99)
Glucose, Bld: 155 mg/dL — ABNORMAL HIGH (ref 70–99)
Glucose, Bld: 172 mg/dL — ABNORMAL HIGH (ref 70–99)
Glucose, Bld: 138 mg/dL — ABNORMAL HIGH (ref 70–99)
Potassium: 5.9 mmol/L — ABNORMAL HIGH (ref 3.5–5.1)
Potassium: 4.7 mmol/L (ref 3.5–5.1)
Potassium: 3.6 mmol/L (ref 3.5–5.1)
Potassium: 3 mmol/L — ABNORMAL LOW (ref 3.5–5.1)
SODIUM: 137 mmol/L (ref 135–145)
Sodium: 137 mmol/L (ref 135–145)
Sodium: 135 mmol/L (ref 135–145)
Sodium: 136 mmol/L (ref 135–145)

## 2018-09-10 LAB — CBG MONITORING, ED
GLUCOSE-CAPILLARY: 136 mg/dL — AB (ref 70–99)
Glucose-Capillary: 223 mg/dL — ABNORMAL HIGH (ref 70–99)
Glucose-Capillary: 150 mg/dL — ABNORMAL HIGH (ref 70–99)
Glucose-Capillary: 157 mg/dL — ABNORMAL HIGH (ref 70–99)
Glucose-Capillary: 157 mg/dL — ABNORMAL HIGH (ref 70–99)
Glucose-Capillary: 146 mg/dL — ABNORMAL HIGH (ref 70–99)

## 2018-09-10 LAB — I-STAT BETA HCG BLOOD, ED (MC, WL, AP ONLY): I-stat hCG, quantitative: <5 m[IU]/mL (ref <5)

## 2018-09-10 LAB — URINALYSIS, ROUTINE W REFLEX MICROSCOPIC
Bacteria, UA: NONE SEEN
Bilirubin Urine: NEGATIVE
Glucose, UA: 150 mg/dL — AB
Ketones, ur: 80 mg/dL — AB
Leukocytes, UA: NEGATIVE
Nitrite: NEGATIVE
PH: 5 (ref 5.0–8.0)
Protein, ur: 100 mg/dL — AB
SPECIFIC GRAVITY, URINE: 1.015 (ref 1.005–1.030)

## 2018-09-10 LAB — I-STAT VENOUS BLOOD GAS, ED
Acid-base deficit: 21 mmol/L — ABNORMAL HIGH (ref 0.0–2.0)
Bicarbonate: 6.6 mmol/L — ABNORMAL LOW (ref 20.0–28.0)
O2 Saturation: 63 %
PCO2 VEN: 20.6 mmHg — AB (ref 44.0–60.0)
TCO2: 7 mmol/L — ABNORMAL LOW (ref 22–32)
pH, Ven: 7.115 — CL (ref 7.250–7.430)
pO2, Ven: 42 mmHg (ref 32.0–45.0)

## 2018-09-10 LAB — INFLUENZA PANEL BY PCR (TYPE A & B)
Influenza A By PCR: NEGATIVE
Influenza B By PCR: NEGATIVE

## 2018-09-10 LAB — LIPASE, BLOOD: Lipase: 185 U/L — ABNORMAL HIGH (ref 11–51)

## 2018-09-10 LAB — CBC
HCT: 46.7 % — ABNORMAL HIGH (ref 36.0–46.0)
Hemoglobin: 14 g/dL (ref 12.0–15.0)
MCH: 30 pg (ref 26.0–34.0)
MCHC: 30 g/dL (ref 30.0–36.0)
MCV: 100 fL (ref 80.0–100.0)
PLATELETS: 351 10*3/uL (ref 150–400)
RBC: 4.67 MIL/uL (ref 3.87–5.11)
RDW: 12.7 % (ref 11.5–15.5)
WBC: 13.2 10*3/uL — ABNORMAL HIGH (ref 4.0–10.5)
nRBC: 0 % (ref 0.0–0.2)

## 2018-09-10 LAB — GLUCOSE, CAPILLARY
GLUCOSE-CAPILLARY: 138 mg/dL — AB (ref 70–99)
Glucose-Capillary: 107 mg/dL — ABNORMAL HIGH (ref 70–99)
Glucose-Capillary: 132 mg/dL — ABNORMAL HIGH (ref 70–99)
Glucose-Capillary: 144 mg/dL — ABNORMAL HIGH (ref 70–99)

## 2018-09-10 LAB — HEMOGLOBIN A1C
HEMOGLOBIN A1C: 4.3 % — AB (ref 4.8–5.6)
Mean Plasma Glucose: 76.71 mg/dL

## 2018-09-10 LAB — ETHANOL

## 2018-09-10 LAB — RAPID URINE DRUG SCREEN, HOSP PERFORMED
Amphetamines: NOT DETECTED
Barbiturates: NOT DETECTED
Benzodiazepines: POSITIVE — AB
Cocaine: NOT DETECTED
Opiates: NOT DETECTED
Tetrahydrocannabinol: NOT DETECTED

## 2018-09-10 LAB — BETA-HYDROXYBUTYRIC ACID
Beta-Hydroxybutyric Acid: 3 mmol/L — ABNORMAL HIGH (ref 0.05–0.27)
Beta-Hydroxybutyric Acid: 1.04 mmol/L — ABNORMAL HIGH (ref 0.05–0.27)

## 2018-09-10 MED ORDER — INSULIN ASPART 100 UNIT/ML ~~LOC~~ SOLN: 0.0000 [IU] | Freq: Three times a day (TID) | SUBCUTANEOUS | Status: DC

## 2018-09-10 MED ORDER — DEXTROSE-NACL 5-0.45 % IV SOLN: INTRAVENOUS | Status: DC

## 2018-09-10 MED ORDER — ACETAMINOPHEN 325 MG PO TABS: 650.0000 mg | ORAL_TABLET | Freq: Four times a day (QID) | ORAL | Status: DC | PRN

## 2018-09-10 MED ORDER — SODIUM CHLORIDE 0.9 % IV BOLUS
1000.0000 mL | Freq: Once | INTRAVENOUS | Status: AC
  Administered 2018-09-10: 1000 mL via INTRAVENOUS

## 2018-09-10 MED ORDER — FAMOTIDINE IN NACL 20-0.9 MG/50ML-% IV SOLN
20.0000 mg | Freq: Once | INTRAVENOUS | Status: AC
  Administered 2018-09-10: 20 mg via INTRAVENOUS
  Filled 2018-09-10: qty 50

## 2018-09-10 MED ORDER — STERILE WATER FOR INJECTION IV SOLN
INTRAVENOUS | Status: DC
  Administered 2018-09-10 – 2018-09-11 (×2): via INTRAVENOUS
  Filled 2018-09-10 (×6): qty 850

## 2018-09-10 MED ORDER — ONDANSETRON HCL 4 MG PO TABS: 4.0000 mg | ORAL_TABLET | Freq: Four times a day (QID) | ORAL | Status: DC | PRN

## 2018-09-10 MED ORDER — ONDANSETRON HCL 4 MG/2ML IJ SOLN: 4.0000 mg | Freq: Four times a day (QID) | INTRAMUSCULAR | Status: DC | PRN

## 2018-09-10 MED ORDER — DEXTROSE 10 % IV SOLN
INTRAVENOUS | Status: DC
  Administered 2018-09-10 – 2018-09-11 (×2): via INTRAVENOUS

## 2018-09-10 MED ORDER — ALPRAZOLAM 0.5 MG PO TABS: 0.5000 mg | ORAL_TABLET | Freq: Every evening | ORAL | Status: DC | PRN

## 2018-09-10 MED ORDER — SODIUM CHLORIDE 0.9 % IV SOLN: INTRAVENOUS | Status: DC

## 2018-09-10 MED ORDER — ACETAMINOPHEN 650 MG RE SUPP: 650.0000 mg | Freq: Four times a day (QID) | RECTAL | Status: DC | PRN

## 2018-09-10 MED ORDER — ONDANSETRON HCL 4 MG/2ML IJ SOLN
4.0000 mg | Freq: Once | INTRAMUSCULAR | Status: AC
  Administered 2018-09-10: 4 mg via INTRAVENOUS
  Filled 2018-09-10: qty 2

## 2018-09-10 MED ORDER — SODIUM CHLORIDE 0.9% FLUSH
3.0000 mL | Freq: Two times a day (BID) | INTRAVENOUS | Status: DC
  Administered 2018-09-10 – 2018-09-11 (×2): 3 mL via INTRAVENOUS

## 2018-09-10 MED ORDER — INSULIN REGULAR(HUMAN) IN NACL 100-0.9 UT/100ML-% IV SOLN
INTRAVENOUS | Status: DC
  Administered 2018-09-10: 0.9 [IU]/h via INTRAVENOUS
  Filled 2018-09-10: qty 100

## 2018-09-10 MED ORDER — ENOXAPARIN SODIUM 40 MG/0.4ML ~~LOC~~ SOLN
40.0000 mg | SUBCUTANEOUS | Status: DC
  Filled 2018-09-10 (×2): qty 0.4

## 2018-09-10 MED ORDER — ESCITALOPRAM OXALATE 10 MG PO TABS
10.0000 mg | ORAL_TABLET | Freq: Every day | ORAL | Status: DC
  Filled 2018-09-10: qty 1

## 2018-09-10 MED ORDER — ZOLPIDEM TARTRATE 5 MG PO TABS
5.0000 mg | ORAL_TABLET | Freq: Once | ORAL | Status: AC
  Administered 2018-09-10: 5 mg via ORAL
  Filled 2018-09-10: qty 1

## 2018-09-10 MED ORDER — SODIUM CHLORIDE 0.9 % IV SOLN
Freq: Once | INTRAVENOUS | Status: AC
  Administered 2018-09-10: 07:00:00 via INTRAVENOUS

## 2018-09-10 MED ORDER — DEXTROSE 50 % IV SOLN: 25.0000 mL | INTRAVENOUS | Status: DC | PRN

## 2018-09-10 NOTE — ED Notes (Signed)
Patient transported to X-ray 

## 2018-09-10 NOTE — Progress Notes (Signed)
Patient to unit from ED. No c/o nausea, A&O and mother at bedside. Telemetry applied, CCMD notified and patient oriented to room, unit and call bell.

## 2018-09-10 NOTE — ED Provider Notes (Addendum)
[facility] EMERGENCY DEPARTMENT Provider Note   CSN: [id number] Arrival date & time: 09/10/18  4098     History   Chief Complaint Chief Complaint  Patient presents with  . Generalized Body Aches    HPI Teresa Hensley is a 42 y.o. female.  HPI  This a 42 year old female with a history of anxiety who presents with nausea and vomiting.  Patient states she has not felt well after the last 2 to 3 days.  She reports recurrent episodes of nonbilious, nonbloody emesis.  She states "I cannot even keep water down."  She denies any significant abdominal pain but does report burning with vomiting.  She also reports some sore throat after "vomiting so much."  She reports chills without documented fevers.  Denies cough, chest pain, shortness of breath.  She reports she has not had a bowel movement in 2 days.  Denies hematuria or dysuria.  Does report frequency.  She is currently on her menstrual period.  Past Medical History:  Diagnosis Date  . ADHD (attention deficit hyperactivity disorder)   . Anxiety   . Seizures (HCC)    as child    There are no active problems to display for this patient.   Past Surgical History:  Procedure Laterality Date  . pelvic mass removal    . TONSILLECTOMY       OB History   None      Home Medications    Prior to Admission medications   Medication Sig Start Date End Date Taking? Authorizing Provider  ALPRAZolam Prudy Feeler) 0.5 MG tablet Take 1 tablet (0.5 mg total) by mouth at bedtime as needed for anxiety. 08/07/18  Yes Oletta Darter, MD  escitalopram (LEXAPRO) 10 MG tablet Take 1 tablet (10 mg total) by mouth daily. 08/07/18  Yes Oletta Darter, MD    Family History No family history on file.  Social History Social History   Tobacco Use  . Smoking status: Never Smoker  . Smokeless tobacco: Never Used  Substance Use Topics  . Alcohol use: Yes    Comment: occasionally  . Drug use: No     Allergies   Cod [fish  allergy]; Codeine; and Lamisil [terbinafine]   Review of Systems Review of Systems  Constitutional: Positive for chills. Negative for fever.  Respiratory: Negative for cough and shortness of breath.   Cardiovascular: Negative for chest pain.  Gastrointestinal: Positive for nausea and vomiting. Negative for abdominal pain and diarrhea.  Endocrine: Positive for polydipsia.  Neurological: Negative for headaches.  All other systems reviewed and are negative.    Physical Exam Updated Vital Signs BP 123/89   Pulse (!) 112   Temp 98.4 F (36.9 C) (Oral)   Resp (!) 30   Ht 1.676 m (5\' 6" )   Wt 59 kg   LMP 09/10/2018 (Exact Date)   SpO2 100%   BMI 20.98 kg/m   Physical Exam  Constitutional: She is oriented to person, place, and time. She appears well-developed and well-nourished. No distress.  HENT:  Head: Normocephalic and atraumatic.  Mucous membranes dry  Eyes: Pupils are equal, round, and reactive to light.  Neck: Neck supple.  Cardiovascular: Regular rhythm and normal heart sounds.  Tachycardia  Pulmonary/Chest: Effort normal and breath sounds normal. No respiratory distress. She has no wheezes.  Abdominal: Soft. Bowel sounds are normal. There is no tenderness. There is no rebound and no guarding.  Neurological: She is alert and oriented to person, place, and time.  Skin: Skin  is warm and dry.  Psychiatric: She has a normal mood and affect.  Nursing note and vitals reviewed.    ED Treatments / Results  Labs (all labs ordered are listed, but only abnormal results are displayed) Labs Reviewed  LIPASE, BLOOD - Abnormal; Notable for the following components:      Result Value   Lipase 185 (*)    All other components within normal limits  COMPREHENSIVE METABOLIC PANEL - Abnormal; Notable for the following components:   Potassium 5.2 (*)    CO2 <7 (*)    Glucose, Bld 244 (*)    Creatinine, Ser 1.57 (*)    Calcium 8.7 (*)    ALT 56 (*)    Total Bilirubin 1.6 (*)     GFR calc non Af Amer 41 (*)    GFR calc Af Amer 47 (*)    All other components within normal limits  CBC - Abnormal; Notable for the following components:   WBC 13.2 (*)    HCT 46.7 (*)    All other components within normal limits  URINALYSIS, ROUTINE W REFLEX MICROSCOPIC - Abnormal; Notable for the following components:   Glucose, UA 150 (*)    Hgb urine dipstick MODERATE (*)    Ketones, ur 80 (*)    Protein, ur 100 (*)    All other components within normal limits  CBG MONITORING, ED - Abnormal; Notable for the following components:   Glucose-Capillary 223 (*)    All other components within normal limits  ETHANOL  HEMOGLOBIN A1C  RAPID URINE DRUG SCREEN, HOSP PERFORMED  I-STAT BETA HCG BLOOD, ED ([facility], [facility], [facility] ONLY)  I-STAT ARTERIAL BLOOD GAS, ED    EKG EKG Interpretation  Date/Time:  Wednesday September 10 2018 04:42:41 EST Ventricular Rate:  122 PR Interval:    QRS Duration: 79 QT Interval:  344 QTC Calculation: 491 R Axis:   80 Text Interpretation:  Sinus tachycardia Borderline T wave abnormalities Borderline prolonged QT interval Baseline wander in lead(s) V5 Confirmed by Ross Marcus ([id number]) on 09/10/2018 4:50:15 AM   Radiology Dg Abdomen Acute W/chest  Result Date: 09/10/2018 CLINICAL DATA:  Vomiting for 3 days. History of abdominal mass resected. EXAM: DG ABDOMEN ACUTE W/ 1V CHEST COMPARISON:  Chest radiograph July 27, 2013 FINDINGS: Cardiomediastinal silhouette is normal. Lungs are clear, no pleural effusions. No pneumothorax. Soft tissue planes and included osseous structures are unremarkable. Paucity of bowel gas. Small volume of retained large bowel stool. No intra-abdominal mass effect, pathologic calcifications or free air. Soft tissue planes and included osseous structures are non-suspicious. IMPRESSION: 1. No acute cardiopulmonary process. 2. Small volume retained large bowel stool, nonspecific bowel gas pattern. Electronically Signed   By: Awilda Metro  M.D.   On: 09/10/2018 06:06    Procedures Procedures (including critical care time)  CRITICAL CARE Performed by: Shon Baton   Total critical care time: 45 minutes  Critical care time was exclusive of separately billable procedures and treating other patients.  Critical care was necessary to treat or prevent imminent or life-threatening deterioration.  Critical care was time spent personally by me on the following activities: development of treatment plan with patient and/or surrogate as well as nursing, discussions with consultants, evaluation of patient's response to treatment, examination of patient, obtaining history from patient or surrogate, ordering and performing treatments and interventions, ordering and review of laboratory studies, ordering and review of radiographic studies, pulse oximetry and re-evaluation of patient's condition.   Medications Ordered in ED  Medications  sodium chloride 0.9 % bolus 1,000 mL (has no administration in time range)  sodium chloride 0.9 % bolus 1,000 mL (0 mLs Intravenous Stopped 09/10/18 0617)  ondansetron (ZOFRAN) injection 4 mg (4 mg Intravenous Given 09/10/18 0516)  0.9 %  sodium chloride infusion ( Intravenous New Bag/Given 09/10/18 0630)     Initial Impression / Assessment and Plan / ED Course  I have reviewed the triage vital signs and the nursing notes.  Pertinent labs & imaging results that were available during my care of the patient were reviewed by me and considered in my medical decision making (see chart for details).     Patient presents mostly with nausea and vomiting over the last 2 to 3 days.  Denies any significant abdominal pain.  She is notably tachycardic and hypertensive on initial assessment.  She appears dry.  No significant reproducible abdominal pain.  She does report decreased bowel movements.  Lab work obtained.  Acute abdominal series obtained to screen for SBO.  Other considerations include gastritis,  gastroenteritis, pancreatitis although would suspect she would have some abdominal pain.  Patient was given fluids and Zofran.  She has several lab abnormalities including lipase of 185 which is greater than 3 times upper limit of normal, elevated LFTs with a bilirubin of 1.6, leukocytosis, and acute kidney injury with a creatinine of 1.57.  Anion gap is not calculable given bicarb of less than 7.  It is at least 26.  She does have a blood sugar of 233 but no history of diabetes.  We will give additional fluid bolus.  Patient denies any significant alcohol use.  He does report recent strict following of a ketotic diet but states that her blood sugars at home have been between 70 and 90.  Picture is very mixed.  DKA is certainly consideration; however, this does not fit her clinical history.  We will repeat a BMP after hydration.  Will obtain a right upper quadrant ultrasound.  On recheck, she continues to endorse nausea and "I just do not feel good."  I discussed with her that she would need admission for further work-up.  Differential is broad at this time.  This was discussed with the hospitalist, Dr. Katrinka BlazingSmith.  At this time we will hold off on starting an insulin drip and repeat BMP.  I have added a hemoglobin A1c, EtOH, ABG.  Patient was started on maintenance fluid.  Patient was discussed with Dr. Katrinka BlazingSmith.  He agrees with ongoing plan.  6:51 AM Patient was updated on plan of care.  She is requesting something to drink.  I discussed with her that until she has a right upper quadrant ultrasound, she needs to stay n.p.o.  She expresses frustration with this.  She subsequently has refused right upper quadrant ultrasound until "you test for flu."  I discussed with her that I did not feel that this was indicated.  She has not had any fevers or upper respiratory symptoms.  However, will obtain influenza swab if this means patient will comply with getting right upper quadrant ultrasound.  Patient seems very skeptical  of work-up.  I have asked her follow-up questions regarding any supplements she may be taking that could cause these metabolic derangements.  Patient does report that she was taking supplements for parasites from her "paracytologist."  It is unclear what exactly supplements she is taking.  Final Clinical Impressions(s) / ED Diagnoses   Final diagnoses:  Epigastric pain  AKI (acute kidney injury) (HCC)  Elevated LFTs  Elevated lipase  Increased anion gap metabolic acidosis  Non-intractable vomiting with nausea, unspecified vomiting type    ED Discharge Orders    None       , Mayer Masker, MD 09/10/18 1610    Shon Baton, MD 09/10/18 [phone]

## 2018-09-10 NOTE — ED Notes (Signed)
Per Dr. Ophelia CharterYates, continue insulin drip at set rate @0 .9units/hr  And not to use glucose stabilizer anymore and check Blood sugar every hour

## 2018-09-10 NOTE — H&P (Addendum)
History and Physical    Teresa Hensley ZOX:[id number] DOB: [birth year] DOA: 09/10/2018  PCP: Hyacinth Meeker Consultants:  Doran Heater - ENT Patient coming from:  Home - lives alone; NOK: Mother, [phone]  Chief Complaint: n/v  HPI: Teresa Hensley is a 42 y.o. female with medical history significant of parasitic infection, seizures, and ADHD presenting with n/v.  She reports that she had sudden onset of vomiting 3 days ago.  She had been perfectly fine prior other than random intermittent episodes of loose stools that had resolved with a normal BM the day before the onset of emesis.  She has had profuse vomiting, counting up to 40 times yesterday.  She has felt achy in her head and body and was convinced she had the flu.  She reports a prolonged period of parasite infestation - roundworms, tapeworms, and Giardia - from 2015-2017 which she reports resulted from foreign travel, sushi (included tuna tartare and steak tartare - her family owns the [employer] in town), and camping.  She finally went to a wellness center, and they referred her to a veterinarian that tests people and she reports being positive and treated.   She reports being on a "strict" keto diet for the last month with "high ketosis"; she has been checking her glucose and it has been 70-90s.  She does take a multitude of supplements from [facility], which is located in [place].  She reports having a "low fever" with temps in the 95 range recently.   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  She was negative for Toxocara Ab in 2016 and there was no other apparent parasite testing done in the [place] system or at [place], [place], or [place].  She has had a number of psychiatric diagnoses including MDD, GAD with suidical ideation, ADHD, and insomnia.  She denies h/o eating d/o or known body dysmorphic conditions.  I  do not see evidence of mass that she reports with abscess in 2013, but our records do not go back that far.    ED Course:  Carryover, per Dr. Katrinka Blazing: Patient reports being on ketotic diet and may be taking unknown supplements. Labs revealed WBC 13.2, potassium 5.2, CO2 <7, nightly 9, creatinine 1.57, glucose 244, lipase 185 anion gap >26. Acute abdominal series showed no acute cardiopulmonary process and a small volume of retained stool with nonspecific gas pattern. Urinalysis, venous bld gas, UDS, hemoglobin A1c, alcohol level, influenza screen( requested by patient), and repeat BMP pending. Patient had been given 2 L of normal saline IV fluids and Zofran. Question possibility of DKA.    Review of Systems: As per HPI; otherwise review of systems reviewed and negative.   Ambulatory Status:  Ambulates without assistance  Past Medical History:  Diagnosis Date  . ADHD (attention deficit hyperactivity disorder)   . Anxiety   . Seizures (HCC)    as child    Past Surgical History:  Procedure Laterality Date  . pelvic mass removal    . TONSILLECTOMY      Social History   Socioeconomic History  . Marital status: Single    Spouse name: Not on file  . Number of children: 0  . Years of education: Not on file  . Highest education level: Bachelor's degree (e.g., BA, AB, BS)  Occupational History  . Not on file  Social Needs  . Financial resource strain: Not  hard at all  . Food insecurity:    Worry: Never true    Inability: Never true  . Transportation needs:    Medical: No    Non-medical: No  Tobacco Use  . Smoking status: Never Smoker  . Smokeless tobacco: Never Used  Substance and Sexual Activity  . Alcohol use: Yes    Comment: occasionally  . Drug use: No  . Sexual activity: Not Currently  Lifestyle  . Physical activity:    Days per week: 5 days    Minutes per session: 60 min  . Stress: Rather much  Relationships  . Social connections:    Talks on phone: More than  three times a week    Gets together: More than three times a week    Attends religious service: Not on file    Active member of club or organization: No    Attends meetings of clubs or organizations: Never    Relationship status: Never married  . Intimate partner violence:    Fear of current or ex partner: Not on file    Emotionally abused: Not on file    Physically abused: Not on file    Forced sexual activity: Not on file  Other Topics Concern  . Not on file  Social History Narrative  . Not on file    Allergies  Allergen Reactions  . [facility] Allergy]   . Codeine Itching and Nausea Only  . Lamisil [Terbinafine]     " JUST CRAZINESS"     No family history on file.  Prior to Admission medications   Medication Sig Start Date End Date Taking? Authorizing Provider  ALPRAZolam Prudy Feeler) 0.5 MG tablet Take 1 tablet (0.5 mg total) by mouth at bedtime as needed for anxiety. 08/07/18  Yes Oletta Darter, MD  escitalopram (LEXAPRO) 10 MG tablet Take 1 tablet (10 mg total) by mouth daily. 08/07/18  Yes Oletta Darter, MD    Physical Exam: Vitals:   09/10/18 0545 09/10/18 0600 09/10/18 0940 09/10/18 1252  BP: (!) 128/95 123/89 112/85 115/84  Pulse: (!) 119 (!) 112 (!) 105 98  Resp: 19 (!) 30 19 18   Temp:      TempSrc:      SpO2: 95% 100% 100% 99%  Weight:      Height:         General:  Appears calm and comfortable and is NAD Eyes:  PERRL, EOMI, normal lids, iris ENT:  grossly normal hearing, lips & tongue, mmm; appropriate dentition Neck:  no LAD, masses or thyromegaly Cardiovascular:  RR with tachycardia, no m/r/g. No LE edema.  Respiratory:   CTA bilaterally with no wheezes/rales/rhonchi.  Normal respiratory effort. Abdomen:  soft, NT, ND, NABS Back:   normal alignment, no CVAT Skin:  no rash or induration seen on limited exam Musculoskeletal:  grossly normal tone BUE/BLE, good ROM, no bony abnormality Lower extremity:  No LE edema.  Limited foot exam with no  ulcerations.  2+ distal pulses. Psychiatric: somewhat anxious mood and affect, speech fluent and appropriate but mildly tangential and quite verbose, AOx3 Neurologic:  CN 2-12 grossly intact, moves all extremities in coordinated fashion, sensation intact    Radiological Exams on Admission: Dg Abdomen Acute W/chest  Result Date: 09/10/2018 CLINICAL DATA:  Vomiting for 3 days. History of abdominal mass resected. EXAM: DG ABDOMEN ACUTE W/ 1V CHEST COMPARISON:  Chest radiograph July 27, 2013 FINDINGS: Cardiomediastinal silhouette is normal. Lungs are clear, no pleural effusions. No pneumothorax. Soft tissue planes  and included osseous structures are unremarkable. Paucity of bowel gas. Small volume of retained large bowel stool. No intra-abdominal mass effect, pathologic calcifications or free air. Soft tissue planes and included osseous structures are non-suspicious. IMPRESSION: 1. No acute cardiopulmonary process. 2. Small volume retained large bowel stool, nonspecific bowel gas pattern. Electronically Signed   By: Awilda Metroourtnay  Bloomer M.D.   On: 09/10/2018 06:06   [place] Abdomen Limited Ruq  Result Date: 09/10/2018 CLINICAL DATA:  Right upper quadrant and epigastric pain with vomiting for 3 days. EXAM: ULTRASOUND ABDOMEN LIMITED RIGHT UPPER QUADRANT COMPARISON:  None. FINDINGS: Gallbladder: No gallstones or wall thickening visualized. No sonographic Murphy sign noted by sonographer. Common bile duct: Diameter: 5 mm Liver: Mildly increased parenchymal echogenicity diffusely without focal abnormality identified. Portal vein is patent on color Doppler imaging with normal direction of blood flow towards the liver. IMPRESSION: 1. Mildly echogenic liver which may reflect steatosis. 2. No gallstones or biliary dilatation. Electronically Signed   By: Sebastian AcheAllen  Grady M.D.   On: 09/10/2018 09:31    EKG: Independently reviewed.  Sinus tachycardia with rate 122; nonspecific ST changes with no evidence of acute  ischemia   Labs on Admission: I have personally reviewed the available labs and imaging studies at the time of the admission.  Pertinent labs:   K+ 5.2 CO2 <7 Glucose 244 BUN 9/Creatinine 1.57/GFR 41 Lipase 185 AST 38/ALT 56/Bili 1.6 WBC 13.2 UA: Glu 150, moderate Hgb, 80 ketones, 100 protein ETOH negative HCG negative VBG: 7.115/20.6/7/6.6  Assessment/Plan Principal Problem:   Starvation ketoacidosis Active Problems:   Psychiatric illness    Starvation ketoacidosis -Patient without significant medical problems presenting with profound metabolic acidosis, bicarb <6, pH 7.1 -A1c is 4.3, indicating that she does not have DKA since she does not have diabetes -After discussion with the patient, the most likely etiology is starvation ketoacidosis in the setting of strict keto diet - High fat, medium protein, very low carb - fat gets metabolized and triglycerides have a different metabolic chain and use ketones for energy production. -She has underlying psychiatric illness (see below), and so likely did not have the usual physiologic cues to cause her to stop this aggressive diet -Patient discussed with Dr. Arlean HoppingSchertz, who was extremely helpful and who guided me to a published study called "Another 'D' in MUDPILES? A Review of Diet-Associated Nondiabetic Ketoacidosis" -This article discusses about how dietary ketosis can lead to profound acid-base disturbances due to massive overproduction of ketone bodies that overwhelms the acid buffer system of the body.  -In the case reports cited, most of the patients involved were young in age and many were female; they often presented with n/v and/or diarrhea. -She has been started on bicarb in sterile water at 150 cc with boluses of NS prn to help close her anion gap - but bicarb doesn't fix it in the sickest patients. -For non-diabetic (NDKA) ketoacidosis, check insulin level and follow the  beta-hydroxybutyrate (it is usually off the charts,  [phone]) with the serial BMPs.   -Place on IV insulin, as this is the mainstay of treatment; she will also need D10 to help avoid hypoglycemia. -Trend q4h BMP to ensure closure of gap and improving acidosis -Anticipate ongoing improvement with treatment as above; dialysis and mechanical ventilation may have a temporizing role but would be secondary considerations if the treatments above are not successful -She is taking a number of homeopathic supplements which may have also contributed; there is little data for benefit from these supplements she  has been taking and should likely be discontinued -Will admit to SDU, as it is anticipated that she will need >2MN of ongoing monitoring -Clear liquid diet for now  Psychiatric illness -Patient with h/o anxiety, depression with SI, ADHD, insomnia -She was borderline manic at the time of my evaluation -Possible underlying bipolar d/o -Suggest outpatient f/u, as this may have contributed to her overindulgence with her keto diet and clearly placed her life at risk   DVT prophylaxis:  Lovenox Code Status:  Full  Family Communication: None present  Disposition Plan:  Home once clinically improved Consults called: Nephrology - telephone only  Admission status: Admit - It is my clinical opinion that admission to INPATIENT is reasonable and necessary because of the expectation that this patient will require hospital care that crosses at least 2 midnights to treat this condition based on the medical complexity of the problems presented.  Given the aforementioned information, the predictability of an adverse outcome is felt to be significant.   Total critical care time: 70 minutes Critical care time was exclusive of separately billable procedures and treating other patients. Critical care was necessary to treat or prevent imminent or life-threatening deterioration. Critical care was time spent personally by me on the following activities: development of  treatment plan with patient and/or surrogate as well as nursing, discussions with consultants, evaluation of patient's response to treatment, examination of patient, obtaining history from patient or surrogate, ordering and performing treatments and interventions, ordering and review of laboratory studies, ordering and review of radiographic studies, pulse oximetry and re-evaluation of patient's condition.     Jonah Blue MD [facility]  If note is complete, please contact covering daytime or nighttime physician. [email or web address] Password [email or web address]  09/10/2018, 1:13 PM

## 2018-09-10 NOTE — Plan of Care (Signed)
Case discussed with Dr. Wilkie AyeHorton.  Patient with previous history of parasites; presents with 2-3 day history N/V and malaise. Patient reports being on ketotic diet and may be taking unknown supplements.  Labs revealed WBC 13.2, potassium 5.2, CO2 <7, nightly 9, creatinine 1.57, glucose 244, lipase 185 anion gap >26.  Acute abdominal series showed no acute cardiopulmonary process and a small volume of retained stool with nonspecific gas pattern.  Urinalysis, venous bld gas, UDS, hemoglobin A1c, alcohol level, influenza screen( requested by patient), and repeat BMP pending.  Patient had been given 2 L of normal saline IV fluids and Zofran.  Question possibility of DKA.  Deferred bed order at this time as it is not clear whether patient is need of a stepdown or floor bed.

## 2018-09-10 NOTE — ED Triage Notes (Signed)
Patient presents to ED via [facility]  C/o generalized bodyaches n/v x 3 days denies diarrhea, states she has urinary freg. Prior to vomiting. C/o increased thrist.

## 2018-09-10 NOTE — Progress Notes (Signed)
RT unable to obtain ABG. Pt. Did not tolerate the procedure and refused. MD notified.

## 2018-09-11 ENCOUNTER — Other Ambulatory Visit: Payer: Self-pay

## 2018-09-11 LAB — COMPREHENSIVE METABOLIC PANEL
ALT: 39 U/L (ref 0–44)
AST: 39 U/L (ref 15–41)
Albumin: 3.6 g/dL (ref 3.5–5.0)
Alkaline Phosphatase: 37 U/L — ABNORMAL LOW (ref 38–126)
Anion gap: 14 (ref 5–15)
CO2: 27 mmol/L (ref 22–32)
Calcium: 8.7 mg/dL — ABNORMAL LOW (ref 8.9–10.3)
Chloride: 98 mmol/L (ref 98–111)
Creatinine, Ser: 0.73 mg/dL (ref 0.44–1.00)
GFR calc Af Amer: >60 mL/min (ref >60)
GFR calc non Af Amer: >60 mL/min (ref >60)
Glucose, Bld: 96 mg/dL (ref 70–99)
Potassium: 3.7 mmol/L (ref 3.5–5.1)
Sodium: 139 mmol/L (ref 135–145)
Total Bilirubin: 1.5 mg/dL — ABNORMAL HIGH (ref 0.3–1.2)
Total Protein: 5.8 g/dL — ABNORMAL LOW (ref 6.5–8.1)

## 2018-09-11 LAB — GLUCOSE, CAPILLARY
GLUCOSE-CAPILLARY: 122 mg/dL — AB (ref 70–99)
Glucose-Capillary: 127 mg/dL — ABNORMAL HIGH (ref 70–99)
Glucose-Capillary: 104 mg/dL — ABNORMAL HIGH (ref 70–99)
Glucose-Capillary: 130 mg/dL — ABNORMAL HIGH (ref 70–99)
Glucose-Capillary: 113 mg/dL — ABNORMAL HIGH (ref 70–99)
Glucose-Capillary: 100 mg/dL — ABNORMAL HIGH (ref 70–99)

## 2018-09-11 LAB — HIV ANTIBODY (ROUTINE TESTING W REFLEX): HIV Screen 4th Generation wRfx: NONREACTIVE

## 2018-09-11 LAB — CBC
HCT: 32.4 % — ABNORMAL LOW (ref 36.0–46.0)
Hemoglobin: 10.9 g/dL — ABNORMAL LOW (ref 12.0–15.0)
MCH: 30.4 pg (ref 26.0–34.0)
MCHC: 33.6 g/dL (ref 30.0–36.0)
MCV: 90.5 fL (ref 80.0–100.0)
Platelets: 208 10*3/uL (ref 150–400)
RBC: 3.58 MIL/uL — ABNORMAL LOW (ref 3.87–5.11)
RDW: 12.1 % (ref 11.5–15.5)
WBC: 5.9 10*3/uL (ref 4.0–10.5)
nRBC: 0 % (ref 0.0–0.2)

## 2018-09-11 LAB — BASIC METABOLIC PANEL
Anion gap: 13 (ref 5–15)
Anion gap: 13 (ref 5–15)
Anion gap: 9 (ref 5–15)
BUN: <5 mg/dL — ABNORMAL LOW (ref 6–20)
BUN: <5 mg/dL — ABNORMAL LOW (ref 6–20)
BUN: <5 mg/dL — ABNORMAL LOW (ref 6–20)
CO2: 27 mmol/L (ref 22–32)
CO2: 28 mmol/L (ref 22–32)
CO2: 34 mmol/L — ABNORMAL HIGH (ref 22–32)
CREATININE: 0.73 mg/dL (ref 0.44–1.00)
Calcium: 8.3 mg/dL — ABNORMAL LOW (ref 8.9–10.3)
Calcium: 8.4 mg/dL — ABNORMAL LOW (ref 8.9–10.3)
Calcium: 8.4 mg/dL — ABNORMAL LOW (ref 8.9–10.3)
Chloride: 98 mmol/L (ref 98–111)
Chloride: 98 mmol/L (ref 98–111)
Chloride: 98 mmol/L (ref 98–111)
Creatinine, Ser: 0.73 mg/dL (ref 0.44–1.00)
Creatinine, Ser: 0.78 mg/dL (ref 0.44–1.00)
GFR calc Af Amer: >60 mL/min (ref >60)
GFR calc Af Amer: >60 mL/min (ref >60)
GFR calc non Af Amer: >60 mL/min (ref >60)
GFR calc non Af Amer: >60 mL/min (ref >60)
GFR calc non Af Amer: >60 mL/min (ref >60)
GLUCOSE: 113 mg/dL — AB (ref 70–99)
Glucose, Bld: 121 mg/dL — ABNORMAL HIGH (ref 70–99)
Glucose, Bld: 127 mg/dL — ABNORMAL HIGH (ref 70–99)
Potassium: 2.7 mmol/L — CL (ref 3.5–5.1)
Potassium: 3.4 mmol/L — ABNORMAL LOW (ref 3.5–5.1)
Potassium: 4 mmol/L (ref 3.5–5.1)
Sodium: 138 mmol/L (ref 135–145)
Sodium: 139 mmol/L (ref 135–145)
Sodium: 141 mmol/L (ref 135–145)

## 2018-09-11 LAB — BETA-HYDROXYBUTYRIC ACID
BETA-HYDROXYBUTYRIC ACID: 0.17 mmol/L (ref 0.05–0.27)
BETA-HYDROXYBUTYRIC ACID: 0.7 mmol/L — AB (ref 0.05–0.27)
Beta-Hydroxybutyric Acid: 0.66 mmol/L — ABNORMAL HIGH (ref 0.05–0.27)
Beta-Hydroxybutyric Acid: 0.11 mmol/L (ref 0.05–0.27)

## 2018-09-11 LAB — INSULIN, RANDOM: Insulin: 20.6 u[IU]/mL (ref 2.6–24.9)

## 2018-09-11 MED ORDER — PHENOL 1.4 % MT LIQD: 2.0000 | OROMUCOSAL | Status: DC | PRN

## 2018-09-11 MED ORDER — POTASSIUM CHLORIDE 10 MEQ/100ML IV SOLN
INTRAVENOUS | Status: AC
  Administered 2018-09-11: 10 meq via INTRAVENOUS
  Filled 2018-09-11: qty 100

## 2018-09-11 MED ORDER — SALINE SPRAY 0.65 % NA SOLN
1.0000 | NASAL | Status: DC | PRN
  Administered 2018-09-11: 1 via NASAL
  Filled 2018-09-11: qty 44

## 2018-09-11 MED ORDER — POTASSIUM CHLORIDE 10 MEQ/100ML IV SOLN
10.0000 meq | INTRAVENOUS | Status: AC
  Administered 2018-09-11 (×4): 10 meq via INTRAVENOUS
  Filled 2018-09-11 (×3): qty 100

## 2018-09-11 MED ORDER — PANTOPRAZOLE SODIUM 40 MG PO TBEC: 40.0000 mg | DELAYED_RELEASE_TABLET | Freq: Every day | ORAL | 0 refills | Status: DC

## 2018-09-11 MED ORDER — PANTOPRAZOLE SODIUM 40 MG PO TBEC
40.0000 mg | DELAYED_RELEASE_TABLET | Freq: Every day | ORAL | Status: DC
  Administered 2018-09-11: 40 mg via ORAL
  Filled 2018-09-11: qty 1

## 2018-09-11 MED ORDER — FAMOTIDINE 20 MG PO TABS
40.0000 mg | ORAL_TABLET | Freq: Two times a day (BID) | ORAL | Status: DC
  Administered 2018-09-11: 40 mg via ORAL
  Filled 2018-09-11: qty 2

## 2018-09-11 MED ORDER — POTASSIUM CHLORIDE 20 MEQ/15ML (10%) PO SOLN
40.0000 meq | Freq: Once | ORAL | Status: AC
  Administered 2018-09-11: 40 meq via ORAL
  Filled 2018-09-11: qty 30

## 2018-09-11 MED ORDER — FAMOTIDINE 40 MG PO TABS: 40.0000 mg | ORAL_TABLET | Freq: Two times a day (BID) | ORAL | 0 refills | Status: DC

## 2018-09-11 NOTE — Discharge Summary (Signed)
Physician Discharge Summary  Chauncy PassyWendy Wolfrey ZOX:[id number] DOB: 05/19/1976 DOA: 09/10/2018  PCP: Teresa Hensley, No Pcp Per  Admit date: 09/10/2018 Discharge date: 09/11/2018  Time spent: 25 minutes  Recommendations for Outpatient Follow-up:  1. Teresa Hensley should follow-up with her primary care physician and get periodic labs  Discharge Diagnoses:  Principal Problem:   Starvation ketoacidosis Active Problems:   Psychiatric illness   Discharge Condition: Proved  Diet recommendation: Regular  Filed Weights   09/10/18 0428  Weight: 59 kg    History of present illness:  This is a 42 year old Caucasian female history of parasitic infection seizures ADHD who presented with 3 days of vomiting She has had prolonged period of parasitic infestations with her hours to present Giardia from foreign travel and camping She has been on a strict keto diet for the past month with high ketosis taking a multitude of supplements and having low-grade fevers She came in with lipase 185 anion gap of 26 CO2 less than 7 imaging was negative ABG showed acidosis She was discussed with nephrologist who recommended starting on bicarb with 150 cc boluses and kept on D10 Her labs steadily improved-she was able to tolerate a full diet and was discharged home in stable state with some antacid medications   Discharge Exam: Vitals:   09/11/18 0811 09/11/18 1158  BP: 97/69 98/60  Pulse: (!) 111 80  Resp:  18  Temp:    SpO2: 92% 96%    General: Alert pleasant no distress Cardiovascular: EOMI NCAT S1-S2 no murmur rub or gallop Respiratory: Clinically clear no added some Abdomen soft nontender nondistended no rebound  Discharge Instructions   Discharge Instructions    Diet - low sodium heart healthy   Complete by:  As directed    Discharge instructions   Complete by:  As directed    Please consume a regular diet  You do not have diabetes-but you should be careful with restrictive diets and do this only under  medical supervision   Increase activity slowly   Complete by:  As directed      Allergies as of 09/11/2018      Reactions   Cod [fish Allergy]    Codeine Itching, Nausea Only   Lamisil [terbinafine]    " JUST CRAZINESS"       Medication List    TAKE these medications   ALPRAZolam 0.5 MG tablet Commonly known as:  XANAX Take 1 tablet (0.5 mg total) by mouth at bedtime as needed for anxiety.   escitalopram 10 MG tablet Commonly known as:  LEXAPRO Take 1 tablet (10 mg total) by mouth daily.   famotidine 40 MG tablet Commonly known as:  PEPCID Take 1 tablet (40 mg total) by mouth 2 (two) times daily.   pantoprazole 40 MG tablet Commonly known as:  PROTONIX Take 1 tablet (40 mg total) by mouth daily. Start taking on:  09/12/2018      Allergies  Allergen Reactions  . Cod [facility][Fish Allergy]   . Codeine Itching and Nausea Only  . Lamisil [Terbinafine]     " JUST CRAZINESS"       The results of significant diagnostics from this hospitalization (including imaging, microbiology, ancillary and laboratory) are listed below for reference.    Significant Diagnostic Studies: Dg Abdomen Acute W/chest  Result Date: 09/10/2018 CLINICAL DATA:  Vomiting for 3 days. History of abdominal mass resected. EXAM: DG ABDOMEN ACUTE W/ 1V CHEST COMPARISON:  Chest radiograph July 27, 2013 FINDINGS: Cardiomediastinal silhouette is normal. Lungs are  clear, no pleural effusions. No pneumothorax. Soft tissue planes and included osseous structures are unremarkable. Paucity of bowel gas. Small volume of retained large bowel stool. No intra-abdominal mass effect, pathologic calcifications or free air. Soft tissue planes and included osseous structures are non-suspicious. IMPRESSION: 1. No acute cardiopulmonary process. 2. Small volume retained large bowel stool, nonspecific bowel gas pattern. Electronically Signed   By: Awilda Metro M.D.   On: 09/10/2018 06:06   US Abdomen Limited Ruq  Result Date:  09/10/2018 CLINICAL DATA:  Right upper quadrant and epigastric pain with vomiting for 3 days. EXAM: ULTRASOUND ABDOMEN LIMITED RIGHT UPPER QUADRANT COMPARISON:  None. FINDINGS: Gallbladder: No gallstones or wall thickening visualized. No sonographic Murphy sign noted by sonographer. Common bile duct: Diameter: 5 mm Liver: Mildly increased parenchymal echogenicity diffusely without focal abnormality identified. Portal vein is patent on color Doppler imaging with normal direction of blood flow towards the liver. IMPRESSION: 1. Mildly echogenic liver which may reflect steatosis. 2. No gallstones or biliary dilatation. Electronically Signed   By: Sebastian Ache M.D.   On: 09/10/2018 09:31    Microbiology: No results found for this or any previous visit (from the past 240 hour(s)).   Labs: Basic Metabolic Panel: Recent Labs  Lab 09/10/18 1846 09/10/18 2352 09/11/18 0314 09/11/18 0723 09/11/18 1235  NA 137 138 139 141 139  K 3.0* 2.7* 4.0 3.4* 3.7  CL 104 98 98 98 98  CO2 22 27 28  34* 27  GLUCOSE 138* 127* 121* 113* 96  BUN <5* <5* <5* <5* <5*  CREATININE 0.89 0.73 0.73 0.78 0.73  CALCIUM 8.4* 8.4* 8.4* 8.3* 8.7*   Liver Function Tests: Recent Labs  Lab 09/10/18 0435 09/11/18 1235  AST 38 39  ALT 56* 39  ALKPHOS 54 37*  BILITOT 1.6* 1.5*  PROT 7.9 5.8*  ALBUMIN 4.8 3.6   Recent Labs  Lab 09/10/18 0435  LIPASE 185*   No results for input(s): AMMONIA in the last 168 hours. CBC: Recent Labs  Lab 09/10/18 0435 09/11/18 0314  WBC 13.2* 5.9  HGB 14.0 10.9*  HCT 46.7* 32.4*  MCV 100.0 90.5  PLT 351 208   Cardiac Enzymes: No results for input(s): CKTOTAL, CKMB, CKMBINDEX, TROPONINI in the last 168 hours. BNP: BNP (last 3 results) No results for input(s): BNP in the last 8760 hours.  ProBNP (last 3 results) No results for input(s): PROBNP in the last 8760 hours.  CBG: Recent Labs  Lab 09/11/18 0202 09/11/18 0333 09/11/18 0604 09/11/18 0806 09/11/18 1143  GLUCAP  104* 122* 130* 113* 100*       Signed:  Rhetta Mura MD   [facility] 09/11/2018, 3:12 PM

## 2018-09-11 NOTE — Plan of Care (Signed)

## 2018-09-11 NOTE — Progress Notes (Signed)
CRITICAL VALUE ALERT  Critical Value:  Potassium 2.7  Date & Time Notied:09/11/18 @0057     Provider Notified: Kirtland BouchardK. Schorr of [facility]  Orders Received/Actions taken: Repletion ordered (late entry)

## 2018-10-18 ENCOUNTER — Encounter (HOSPITAL_COMMUNITY): Payer: Self-pay | Admitting: Psychiatry

## 2018-10-18 ENCOUNTER — Ambulatory Visit (INDEPENDENT_AMBULATORY_CARE_PROVIDER_SITE_OTHER): Payer: No Typology Code available for payment source | Admitting: Psychiatry

## 2018-10-18 VITALS — BP 116/64 | Ht 66.0 in | Wt 142.0 lb

## 2018-10-18 DIAGNOSIS — F900 Attention-deficit hyperactivity disorder, predominantly inattentive type: Secondary | ICD-10-CM | POA: Diagnosis not present

## 2018-10-18 DIAGNOSIS — F411 Generalized anxiety disorder: Secondary | ICD-10-CM

## 2018-10-18 MED ORDER — ALPRAZOLAM 0.5 MG PO TABS: 0.5000 mg | ORAL_TABLET | Freq: Every evening | ORAL | 1 refills | Status: DC | PRN

## 2018-10-18 MED ORDER — AMPHETAMINE-DEXTROAMPHET ER 15 MG PO CP24: 15.0000 mg | ORAL_CAPSULE | Freq: Every day | ORAL | 0 refills | Status: DC

## 2018-10-18 MED ORDER — ADDERALL XR 15 MG PO CP24: 15.0000 mg | ORAL_CAPSULE | Freq: Every day | ORAL | 0 refills | Status: DC

## 2018-10-18 NOTE — Progress Notes (Signed)
BH MD/PA/NP OP Progress Note  10/18/2018 11:10 AM Teresa Hensley  MRN:  [id number]  Chief Complaint:  Chief Complaint    Anxiety     Subjective: Today patient tells me that her anxiety is starting to improve.  She was hospitalized for "vomiting 40 times in 1 day".  She had several medical tests done and received treatment for parasitic infections.  She was referred to a that for treatment.  She took antibiotics and felt somewhat better.  She discovered that a piece of furniture in her home had mold on it and was causing skin rashes and other symptoms.  She did not take antibiotics as she was able to recover on her own.  Teresa Hensley feels that her numerous illnesses related to mold and parasites have finally been treated and she is on the mend.  Today she is reporting that her anxiety is starting to improve and that scares her a little bit.  She states that she has gone through a lot and does not want to have further problems.  She is denying any symptoms of depression.  She states that she was not able to tolerate the Lexapro due to nausea so she stopped it.  She is denying isolation, crying spells, sadness, anhedonia.  She denies SI/HI.  She does state that her sleep for the last several days has been off.  She is waking up at 5 AM and unable to go back to sleep.  She tried melatonin but it was not effective.  She then took Benadryl for a few days and states she was able to sleep better.  Since her depression is not an issue at this time she does not want to do a trial with another antidepressant, not even for ongoing anxiety issues.  Her biggest concern at this time is her ADHD.  She states that she is unable to focus and complete tasks, follow instructions.  She is interrupting people in conversations and is not following conversations and interjecting with unrelated topics.  She is extremely unmotivated.  In the past she never had a problem like that when taking Adderall.  She would like to restart and brought  records from her previous psychiatrist who treated her with Adderall.    Visit Diagnosis:    ICD-10-CM   1. GAD (generalized anxiety disorder) F41.1 ALPRAZolam (XANAX) 0.5 MG tablet  2. Attention deficit hyperactivity disorder (ADHD), predominantly inattentive type F90.0 ADDERALL XR 15 MG 24 hr capsule    amphetamine-dextroamphetamine (ADDERALL XR) 15 MG 24 hr capsule         Past Psychiatric History: Per record review and patient history Patient has depression and anxiety symptoms related to her physical illnesses. ADHD dx in childhood- Adderall XR very effective, Vyvanse- not effective. Lexapro ineffective and weight gain.  Patient has been to numerous outpatient psychiatrist and primary care doctors for mental health treatment.  She denies any history of inpatient psychiatric admissions.  She is tried numerous medications and is generally hesitant to make any changes.  Past Medical History:  Past Medical History:  Diagnosis Date  . ADHD (attention deficit hyperactivity disorder)   . Anxiety   . Seizures (HCC)    as child    Past Surgical History:  Procedure Laterality Date  . pelvic mass removal    . TONSILLECTOMY      Family Psychiatric History: Patient reports a family history of OCD   Family History: History reviewed. No pertinent family history.  Social History:  Social History  Socioeconomic History  . Marital status: Single    Spouse name: Not on file  . Number of children: 0  . Years of education: Not on file  . Highest education level: Bachelor's degree (e.g., BA, AB, BS)  Occupational History  . Not on file  Social Needs  . Financial resource strain: Not hard at all  . Food insecurity:    Worry: Never true    Inability: Never true  . Transportation needs:    Medical: No    Non-medical: No  Tobacco Use  . Smoking status: Never Smoker  . Smokeless tobacco: Never Used  Substance and Sexual Activity  . Alcohol use: Yes    Comment: occasionally  .  Drug use: No  . Sexual activity: Not Currently  Lifestyle  . Physical activity:    Days per week: 5 days    Minutes per session: 60 min  . Stress: Rather much  Relationships  . Social connections:    Talks on phone: More than three times a week    Gets together: More than three times a week    Attends religious service: Not on file    Active member of club or organization: No    Attends meetings of clubs or organizations: Never    Relationship status: Never married  Other Topics Concern  . Not on file  Social History Narrative  . Not on file    Allergies:  Allergies  Allergen Reactions  . Tramadol Itching  . Codeine Itching and Nausea Only  . Lamisil [Terbinafine]     " JUST CRAZINESS"     Metabolic Disorder Labs: Lab Results  Component Value Date   HGBA1C 4.3 (L) 09/10/2018   MPG 76.71 09/10/2018   No results found for: PROLACTIN No results found for: CHOL, TRIG, HDL, CHOLHDL, VLDL, LDLCALC   Current Medications: Current Outpatient Medications  Medication Sig Dispense Refill  . ALPRAZolam (XANAX) 0.5 MG tablet Take 1 tablet (0.5 mg total) by mouth at bedtime as needed for anxiety. 30 tablet 1  . famotidine (PEPCID) 40 MG tablet Take 1 tablet (40 mg total) by mouth 2 (two) times daily. 30 tablet 0  . pantoprazole (PROTONIX) 40 MG tablet Take 1 tablet (40 mg total) by mouth daily. 30 tablet 0  . ADDERALL XR 15 MG 24 hr capsule Take 1 capsule by mouth daily. 30 capsule 0  . amphetamine-dextroamphetamine (ADDERALL XR) 15 MG 24 hr capsule Take 1 capsule by mouth daily. 30 capsule 0   No current facility-administered medications for this visit.      Musculoskeletal: Strength & Muscle Tone: within normal limits Gait & Station: normal Patient leans: N/A   Psychiatric Specialty Exam: Review of Systems  Constitutional: Negative for chills, diaphoresis and fever.  Gastrointestinal: Negative for abdominal pain, nausea and vomiting.    Blood pressure 116/64, height  5\' 6"  (1.676 m), weight 142 lb (64.4 kg).Body mass index is 22.92 kg/m.  General Appearance: Fairly Groomed  Eye Contact:  Good  Speech:  Clear and Coherent and pushed rate  Volume:  Normal  Mood:  Anxious  Affect:  Congruent and Tearful  Thought Process:  Coherent and Descriptions of Associations: Circumstantial  Orientation:  Full (Time, Place, and Person)  Thought Content:  Rumination  Suicidal Thoughts:  No  Homicidal Thoughts:  No  Memory:  Immediate;   Good  Judgement:  Poor  Insight:  Shallow  Psychomotor Activity:  Restlessness  Concentration:  Concentration: Poor  Recall:  Good  Fund of Knowledge:  Good  Language:  Good  Akathisia:  No  Handed:  Right  AIMS (if indicated):     Assets:  Desire for Improvement Social Support Talents/Skills Transportation Vocational/Educational  ADL's:  Intact  Cognition:  WNL  Sleep:   poor         I reviewed the information below on 10/18/2018 and updated it Assessment: GAD; MDD-recurrent, moderate; ADHD  As during her last visit she presented with perseveration on somatic complaints, anxiety about physical illnesses.  In reviewing records from Dr. Rene Kocher there does seem to be a history of the same presentation.  Unclear to me if she has hypo-chondrosis or delusions?  I will continue to monitor.  I reviewed the medical records she brought with her from 2015 and prior that show dx of ADHD and treatment with Adderall XR 30mg - will scan into chart   Medication management with supportive therapy. Risks and benefits, side effects and alternative treatment options discussed with patient. Pt was given an opportunity to ask questions about medication, illness, and treatment. All current psychiatric medications have been reviewed and discussed with the patient and adjusted as clinically appropriate. The patient has been provided an accurate and updated list of the medications being now prescribed. Pt verbalized understanding and verbal  consent obtained for treatment.  The risk of un-intended pregnancy is high based on the fact that pt reports she is not using any conceptive. Pt is aware that these meds carry a teratogenic risk. Pt will discuss plan of action if she does or plans to become pregnant in the future.  Status of current problems:  ADHD-ongoing; anxiety is starting to improve; depression is significantly improved  Meds: Start trial of Adderall XR 15mg  po qD for ADHD D/c Lexapro-was causing nausea.  Patient does not want treatment with any antidepressants today Xanax 0.5mg  qD prn-goal is to discontinue.  Patient is aware that medication will be discontinued in the future and has verbalized understanding   Labs: none  Therapy: brief supportive therapy provided. Discussed psychosocial stressors in detail.      Consultations: Referred for therapy Encouraged to follow up with PCP as needed  Pt denies SI and is at an acute low risk for suicide. Patient told to call clinic if any problems occur. Patient advised to go to ER if they should develop SI/HI, side effects, or if symptoms worsen. Pt has crisis numbers to call if needed. Pt acknowledged and agreed with plan and verbalized understanding.  F/up in 2 months or sooner if needed  The duration of this appointment visit was 20 minutes of face-to-face time with the patient.  Greater than 50% of this time was spent in counseling, explanation of  diagnosis, planning of further management, and coordination of care   Oletta Darter, MD 10/18/2018, 11:10 AM

## 2018-12-18 ENCOUNTER — Ambulatory Visit (HOSPITAL_COMMUNITY): Payer: No Typology Code available for payment source | Admitting: Psychiatry

## 2018-12-18 ENCOUNTER — Encounter (HOSPITAL_COMMUNITY): Payer: Self-pay | Admitting: Psychiatry

## 2018-12-18 ENCOUNTER — Other Ambulatory Visit: Payer: Self-pay

## 2018-12-18 VITALS — BP 134/84 | HR 93 | Ht 66.0 in | Wt 132.0 lb

## 2018-12-18 DIAGNOSIS — F900 Attention-deficit hyperactivity disorder, predominantly inattentive type: Secondary | ICD-10-CM

## 2018-12-18 DIAGNOSIS — F411 Generalized anxiety disorder: Secondary | ICD-10-CM

## 2018-12-18 MED ORDER — ALPRAZOLAM 0.5 MG PO TABS: 0.5000 mg | ORAL_TABLET | Freq: Every evening | ORAL | 0 refills | Status: DC | PRN

## 2018-12-18 MED ORDER — AMPHETAMINE-DEXTROAMPHET ER 20 MG PO CP24: 20.0000 mg | ORAL_CAPSULE | Freq: Every day | ORAL | 0 refills | Status: DC

## 2018-12-18 NOTE — Progress Notes (Signed)
BH MD/PA/NP OP Progress Note  12/18/2018 2:54 PM Teresa Hensley  MRN:  [id number]  Chief Complaint:  Chief Complaint    Anxiety; ADHD     Subjective: Mee reports that her physical health has dramatically improved.  She has moved and purged her current belongings.  She gave away all of her furniture and got a new air purifier.  Her sleep has improved.  She spent some time talking about the illness and how impactful it was on her psyche.  She is denying any depression at this point.  She reports that she is back at work with her [employer]  She is going out and is social.  She is denying any SI/HI.  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  She is also very anxious about a lawsuit that she has filed against her previous landlord.  Barbar has been taking Xanax most days.  Yesterday she was feeling anxious but did not take the Xanax because she did not "want to waste a pill".  She has been taking her Adderall and reports that around 3 PM she starts to feel very tense in her neck and her shoulders.  She believes that is when the medication wears off.   Visit Diagnosis:    ICD-10-CM   1. GAD (generalized anxiety disorder) F41.1 ALPRAZolam (XANAX) 0.5 MG tablet  2. Attention deficit hyperactivity disorder (ADHD), predominantly inattentive type F90.0 amphetamine-dextroamphetamine (ADDERALL XR) 20 MG 24 hr capsule         Past Psychiatric History: Per record review and patient history Patient has depression and anxiety symptoms related to her physical illnesses. ADHD dx in childhood- Adderall XR very effective, Vyvanse- not effective. Lexapro ineffective and weight gain.  Patient has been to numerous outpatient psychiatrist and primary care doctors for mental health treatment.  She denies any history of inpatient psychiatric admissions.  She is tried numerous medications and is generally hesitant to make any changes.  Past  Medical History:  Past Medical History:  Diagnosis Date  . ADHD (attention deficit hyperactivity disorder)   . Anxiety   . Seizures (HCC)    as child    Past Surgical History:  Procedure Laterality Date  . pelvic mass removal    . TONSILLECTOMY      Family Psychiatric History: Patient reports a family history of OCD   Family History: History reviewed. No pertinent family history.  Social History:  Social History   Socioeconomic History  . Marital status: Single    Spouse name: Not on file  . Number of children: 0  . Years of education: Not on file  . Highest education level: Bachelor's degree (e.g., BA, AB, BS)  Occupational History  . Not on file  Social Needs  . Financial resource strain: Not hard at all  . Food insecurity:    Worry: Never true    Inability: Never true  . Transportation needs:    Medical: No    Non-medical: No  Tobacco Use  . Smoking status: Never Smoker  . Smokeless tobacco: Never Used  Substance and Sexual Activity  . Alcohol use: Yes    Comment: occasionally  . Drug use: No  . Sexual activity: Not Currently  Lifestyle  . Physical activity:    Days per week: 5 days    Minutes per session: 60 min  . Stress: Rather much  Relationships  . Social connections:    Talks on phone: More than  three times a week    Gets together: More than three times a week    Attends religious service: Not on file    Active member of club or organization: No    Attends meetings of clubs or organizations: Never    Relationship status: Never married  Other Topics Concern  . Not on file  Social History Narrative  . Not on file    Allergies:  Allergies  Allergen Reactions  . Tramadol Itching  . Codeine Itching and Nausea Only  . Lamisil [Terbinafine]     " JUST CRAZINESS"     Metabolic Disorder Labs: Lab Results  Component Value Date   HGBA1C 4.3 (L) 09/10/2018   MPG 76.71 09/10/2018   No results found for: PROLACTIN No results found for: CHOL,  TRIG, HDL, CHOLHDL, VLDL, LDLCALC   Current Medications: Current Outpatient Medications  Medication Sig Dispense Refill  . ALPRAZolam (XANAX) 0.5 MG tablet Take 1 tablet (0.5 mg total) by mouth at bedtime as needed for anxiety. 15 tablet 0  . amphetamine-dextroamphetamine (ADDERALL XR) 20 MG 24 hr capsule Take 1 capsule (20 mg total) by mouth daily. 30 capsule 0   No current facility-administered medications for this visit.      Musculoskeletal: Strength & Muscle Tone: within normal limits Gait & Station: normal Patient leans: N/A    Psychiatric Specialty Exam: Review of Systems  Gastrointestinal: Negative for abdominal pain, constipation, diarrhea, nausea and vomiting.  Musculoskeletal: Positive for joint pain and neck pain. Negative for back pain and falls.    Blood pressure 134/84, pulse 93, height 5\' 6"  (1.676 m), weight 132 lb (59.9 kg).Body mass index is 21.31 kg/m.  General Appearance: Fairly Groomed  Eye Contact:  Good  Speech:  Clear and Coherent and Pressured  Volume:  Normal  Mood:  Anxious  Affect:  Congruent  Thought Process:  Coherent and Descriptions of Associations: Circumstantial  Orientation:  Full (Time, Place, and Person)  Thought Content:  Logical  Suicidal Thoughts:  No  Homicidal Thoughts:  No  Memory:  Immediate;   Good  Judgement:  Poor  Insight:  Shallow  Psychomotor Activity:  Restlessness  Concentration:  Concentration: Fair  Recall:  Fair  Fund of Knowledge:  Good  Language:  Good  Akathisia:  No  Handed:  Right  AIMS (if indicated):     Assets:  Communication Skills Desire for Improvement Financial Resources/Insurance Housing Social Support Transportation Vocational/Educational  ADL's:  Intact  Cognition:  WNL  Sleep:   good          I reviewed the information below on 12/18/2018 and have updated it Assessment: GAD; MDD-recurrent, moderate; ADHD  As during her last visit she presented with perseveration on somatic  complaints, anxiety about physical illnesses.  In reviewing records from Dr. Rene Kocher there does seem to be a history of the same presentation.  Unclear to me if she has hypo-chondrosis or delusions?  I will continue to monitor.    Medication management with supportive therapy. Risks and benefits, side effects and alternative treatment options discussed with patient. Pt was given an opportunity to ask questions about medication, illness, and treatment. All current psychiatric medications have been reviewed and discussed with the patient and adjusted as clinically appropriate. The patient has been provided an accurate and updated list of the medications being now prescribed. Pt verbalized understanding and verbal consent obtained for treatment.  The risk of un-intended pregnancy is high based on the fact that pt reports she  is not using any conceptive. Pt is aware that these meds carry a teratogenic risk. Pt will discuss plan of action if she does or plans to become pregnant in the future.  Status of current problems: Ongoing anxiety  Meds: increase Adderall XR  po qD for ADHD Patient does not want treatment with any antidepressants today Xanax 0.5mg  qD prn-goal is to discontinue.  Today I prescribed 15 tablets with no refills.  I instructed the patient on the plan to wean off and discontinue Xanax.  She asked for 20 tablets stating that she does have a lot of anxiety but I again refused as she was aware that this medication was only a temporary measure.  I discussed potential use of BuSpar or other SSRIs.  Patient feels unsure and wanted to research the medications before making any decisions.   Labs: none  Therapy: brief supportive therapy provided. Discussed psychosocial stressors in detail.      Consultations: Referred for therapy Encouraged to follow up with PCP as needed  Pt denies SI and is at an acute low risk for suicide. Patient told to call clinic if any problems occur. Patient  advised to go to ER if they should develop SI/HI, side effects, or if symptoms worsen. Pt has crisis numbers to call if needed. Pt acknowledged and agreed with plan and verbalized understanding.  F/up in 2 months or sooner if needed  The duration of this appointment visit was 25 minutes of face-to-face time with the patient.  Greater than 50% of this time was spent in counseling, explanation of  diagnosis, planning of further management, and coordination of care   Oletta Darter, MD 12/18/2018, 2:54 PM

## 2019-01-13 ENCOUNTER — Other Ambulatory Visit (HOSPITAL_COMMUNITY): Payer: Self-pay

## 2019-01-13 NOTE — Telephone Encounter (Signed)
Medication refill request - Telephone call with pt as she wanted to report the increased dosage of Adderall XR was working fine but she would need a refill in the coming week. Requests this be sent to the [facility] on [address] close to her home as she is out of work currently.  Agreed to send request to Dr. Michae Kava who works next on 01/15/19 and patient agreed with plan.

## 2019-01-16 ENCOUNTER — Telehealth (HOSPITAL_COMMUNITY): Payer: Self-pay | Admitting: *Deleted

## 2019-01-16 DIAGNOSIS — F900 Attention-deficit hyperactivity disorder, predominantly inattentive type: Secondary | ICD-10-CM

## 2019-01-16 NOTE — Telephone Encounter (Signed)
Patient called asking for a refill of her Adderall. States she called earlier in the week and today took the last pill. Will need it filled today if possible.

## 2019-01-19 ENCOUNTER — Other Ambulatory Visit (HOSPITAL_COMMUNITY): Payer: Self-pay | Admitting: Psychiatry

## 2019-01-19 DIAGNOSIS — F900 Attention-deficit hyperactivity disorder, predominantly inattentive type: Secondary | ICD-10-CM

## 2019-01-19 MED ORDER — AMPHETAMINE-DEXTROAMPHET ER 20 MG PO CP24: 20.0000 mg | ORAL_CAPSULE | Freq: Every day | ORAL | 0 refills | Status: DC

## 2019-01-19 MED ORDER — ADDERALL XR 20 MG PO CP24: 20.0000 mg | ORAL_CAPSULE | Freq: Every day | ORAL | 0 refills | Status: DC

## 2019-01-19 NOTE — Telephone Encounter (Signed)
Medication refill request - Telephone call with patient to inform her medications were inadvertantly printed instead of going to her pharmacy for Adderall refill. Verified order needed to go to [facility] on [address] and [facility] only. Agreed to connect with Dr. Michae Kava to let her know the order did not go through and to request she resend it.  Spoke with Dr. Michae Kava about problem with order and she will resend to [facility] on [address], [facility] only.

## 2019-02-19 ENCOUNTER — Encounter (HOSPITAL_COMMUNITY): Payer: Self-pay | Admitting: Psychiatry

## 2019-02-19 ENCOUNTER — Ambulatory Visit (INDEPENDENT_AMBULATORY_CARE_PROVIDER_SITE_OTHER): Payer: No Typology Code available for payment source | Admitting: Psychiatry

## 2019-02-19 ENCOUNTER — Other Ambulatory Visit: Payer: Self-pay

## 2019-02-19 DIAGNOSIS — F900 Attention-deficit hyperactivity disorder, predominantly inattentive type: Secondary | ICD-10-CM | POA: Diagnosis not present

## 2019-02-19 DIAGNOSIS — F411 Generalized anxiety disorder: Secondary | ICD-10-CM

## 2019-02-19 MED ORDER — ADDERALL XR 20 MG PO CP24: 20.0000 mg | ORAL_CAPSULE | Freq: Every day | ORAL | 0 refills | Status: DC

## 2019-02-19 MED ORDER — ALPRAZOLAM 0.5 MG PO TABS: 0.5000 mg | ORAL_TABLET | Freq: Every evening | ORAL | 0 refills | Status: DC | PRN

## 2019-02-19 NOTE — Progress Notes (Signed)
02/19/2019 11:08 AM Teresa Hensley  MRN:  [id number]   Virtual Visit via Telephone Note  I connected with Teresa Hensley on 02/19/19 at 10:30 AM EDT by telephone and verified that I am speaking with the correct person using two identifiers.  Location: Patient: home Provider: home   I discussed the limitations, risks, security and privacy concerns of performing an evaluation and management service by telephone and the availability of in person appointments. I also discussed with the patient that there may be a patient responsible charge related to this service. The patient expressed understanding and agreed to proceed.   Chief Complaint:  Chief Complaint    Anxiety; ADHD; Follow-up     Subjective: Teresa Hensley she is dealing with a lot. She moved on March 2nd right before the pandemic started. With all the chaos she did not pay her rent for 2 months. She got an eviction notice recently and has hired an Pensions consultant. Teresa Hensley recently tested positive for a tapeworm on April 21st. She had a bad panic attack the other day that came out of no where. Her mother gave her a Xanax and that helped. She has only had 2 panic attacks over the last one month. Today she woke up crying because she is she is dealing with so much. It takes her time to wind down after work. 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  She takes her Adderall to help at work and it is effective. Sleep is fair. She is having nightmares.  Teresa Hensley has chronic anxiety regarding her health.  She has experienced significant and numerous infections over the last 5 years.    Visit Diagnosis:    ICD-10-CM   1. GAD (generalized anxiety disorder) F41.1 ALPRAZolam (XANAX) 0.5 MG tablet  2. Attention deficit hyperactivity disorder (ADHD), predominantly inattentive type F90.0 ADDERALL XR 20 MG 24 hr capsule    ADDERALL XR 20 MG 24 hr capsule         Past Psychiatric History: Per record review and patient history Patient has  depression and anxiety symptoms related to her physical illnesses. ADHD dx in childhood- Adderall XR very effective, Vyvanse- not effective. Lexapro ineffective and weight gain.  Patient has been to numerous outpatient psychiatrist and primary care doctors for mental health treatment.  She denies any history of inpatient psychiatric admissions.  She is tried numerous medications and is generally hesitant to make any changes.  Past Medical History:  Past Medical History:  Diagnosis Date  . ADHD (attention deficit hyperactivity disorder)   . Anxiety   . Seizures (HCC)    as child  . Tapeworm infection     Past Surgical History:  Procedure Laterality Date  . pelvic mass removal    . TONSILLECTOMY      Family Psychiatric History: Patient reports a family history of OCD   Family History: History reviewed. No pertinent family history.  Social History:  Social History   Socioeconomic History  . Marital status: Single    Spouse name: Not on file  . Number of children: 0  . Years of education: Not on file  . Highest education level: Bachelor's degree (e.g., BA, AB, BS)  Occupational History  . Not on file  Social Needs  . Financial resource strain: Not hard at all  . Food insecurity:    Worry: Never true    Inability: Never true  . Transportation needs:    Medical: No    Non-medical: No  Tobacco  Use  . Smoking status: Never Smoker  . Smokeless tobacco: Never Used  Substance and Sexual Activity  . Alcohol use: Yes    Comment: occasionally  . Drug use: No  . Sexual activity: Not Currently  Lifestyle  . Physical activity:    Days per week: 5 days    Minutes per session: 60 min  . Stress: Rather much  Relationships  . Social connections:    Talks on phone: More than three times a week    Gets together: More than three times a week    Attends religious service: Not on file    Active member of club or organization: No    Attends meetings of clubs or organizations: Never     Relationship status: Never married  Other Topics Concern  . Not on file  Social History Narrative  . Not on file    Allergies:  Allergies  Allergen Reactions  . Tramadol Itching  . Codeine Itching and Nausea Only  . Lamisil [Terbinafine]     " JUST CRAZINESS"     Metabolic Disorder Labs: Lab Results  Component Value Date   HGBA1C 4.3 (L) 09/10/2018   MPG 76.71 09/10/2018   No results found for: PROLACTIN No results found for: CHOL, TRIG, HDL, CHOLHDL, VLDL, LDLCALC   Current Medications: Current Outpatient Medications  Medication Sig Dispense Refill  . ADDERALL XR 20 MG 24 hr capsule Take 1 capsule (20 mg total) by mouth daily. 30 capsule 0  . ADDERALL XR 20 MG 24 hr capsule Take 1 capsule (20 mg total) by mouth daily. 30 capsule 0  . albendazole (ALBENZA) 200 MG tablet Take 400 mg by mouth 2 (two) times daily with a meal.    . ALPRAZolam (XANAX) 0.5 MG tablet Take 1 tablet (0.5 mg total) by mouth at bedtime as needed for anxiety. 10 tablet 0  . amphetamine-dextroamphetamine (ADDERALL XR) 20 MG 24 hr capsule Take 1 capsule (20 mg total) by mouth daily. 30 capsule 0   No current facility-administered medications for this visit.      MSE: Teresa Hensley is alert and oriented x4.  She was pleasant and cooperative on the phone.  She was engaged in the conversation and answered questions appropriately.  Speech was clear and coherent with normal tone and volume.  Rate was slightly decreased and there was some shuttering breaths between words.  Mood is anxious and affect was congruent.  Thought processes were circumstantial but overall intact.  Thought content was with ruminations.  Teresa Hensley is denying SI/HI.  She denies auditory and visual hallucinations and did not appear to be responding to internal stimuli.  Attention and memory were fair.  Her fund of knowledge and use of language are average.  Insight and judgment are good.  I am unable to comment on her physical appearance, hygiene, eye  contact or psychomotor activity as I am unable to physically see the patient.       I reviewed the information below on Feb 19, 2019 and have updated it Assessment: GAD; MDD-recurrent, moderate; ADHD  As during her last visit she presented with perseveration on somatic complaints, anxiety about physical illnesses.  In reviewing records from Dr. Rene KocherEksir there does seem to be a history of the same presentation.  Unclear to me if she has hypo-chondrosis or delusions?  I will continue to monitor.    Medication management with supportive therapy. Risks and benefits, side effects and alternative treatment options discussed with patient. Pt was given an  opportunity to ask questions about medication, illness, and treatment. All current psychiatric medications have been reviewed and discussed with the patient and adjusted as clinically appropriate. The patient has been provided an accurate and updated list of the medications being now prescribed. Pt verbalized understanding and verbal consent obtained for treatment.  The risk of un-intended pregnancy is high based on the fact that pt reports she is not using any conceptive. Pt is aware that these meds carry a teratogenic risk. Pt will discuss plan of action if she does or plans to become pregnant in the future.  Status of current problems: anxiety continues   Meds: Adderall XR  po qD for ADHD Patient does not want treatment with any antidepressants today -Xanax 0.5mg  qD prn-goal is to discontinue.  Today I prescribed 10 tablets with no refills.  I instructed the patient on the plan to wean off and discontinue Xanax.  I discussed potential use of BuSpar or other SSRIs.  Patient feels unsure and wanted to research the medications before making any decisions.   Labs: none  Therapy: brief supportive therapy provided. Discussed psychosocial stressors in detail.      Consultations:   Pt denies SI and is at an acute low risk for suicide. Patient  told to call clinic if any problems occur. Patient advised to go to ER if they should develop SI/HI, side effects, or if symptoms worsen. Pt has crisis numbers to call if needed. Pt acknowledged and agreed with plan and verbalized understanding.  F/up in 2 months or sooner if needed  I provided 20 minutes of non-face-to-face time during this encounter   Oletta Darter, MD 02/19/2019, 11:08 AM

## 2019-04-23 ENCOUNTER — Other Ambulatory Visit: Payer: Self-pay

## 2019-04-23 ENCOUNTER — Ambulatory Visit (INDEPENDENT_AMBULATORY_CARE_PROVIDER_SITE_OTHER): Payer: No Typology Code available for payment source | Admitting: Psychiatry

## 2019-04-23 ENCOUNTER — Encounter (HOSPITAL_COMMUNITY): Payer: Self-pay | Admitting: Psychiatry

## 2019-04-23 DIAGNOSIS — F900 Attention-deficit hyperactivity disorder, predominantly inattentive type: Secondary | ICD-10-CM | POA: Diagnosis not present

## 2019-04-23 DIAGNOSIS — F411 Generalized anxiety disorder: Secondary | ICD-10-CM

## 2019-04-23 MED ORDER — ADDERALL XR 20 MG PO CP24: 20.0000 mg | ORAL_CAPSULE | Freq: Every day | ORAL | 0 refills | Status: DC

## 2019-04-23 MED ORDER — ADDERALL 20 MG PO TABS: 20.0000 mg | ORAL_TABLET | Freq: Every day | ORAL | 0 refills | Status: DC

## 2019-04-23 NOTE — Progress Notes (Signed)
Virtual Visit via Telephone Note  I connected with Teresa Hensley Perreira on 04/23/19 at 10:30 AM EDT by telephone and verified that I am speaking with the correct person using two identifiers.  Location: Patient: home Provider: Office   I discussed the limitations, risks, security and privacy concerns of performing an evaluation and management service by telephone and the availability of in person appointments. I also discussed with the patient that there may be a patient responsible charge related to this service. The patient expressed understanding and agreed to proceed.   History of Present Illness: Patient reports that she is doing well.  Working has significantly helped with her anxiety.  She had has 6 of the 10 tablets that were prescribed in May still left.  This is a big accomplishment for her as she has had several stressors since then.  She has some ongoing issues with her apartment complex but states that her apartment is clean and she does feel safe there.  She denies any physical symptoms related to any sort of infection with a tapeworm, fungus, bacteria or virus.  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  She has been taking the Adderall around 7 AM and notes that by 3 PM the effect wears off.  She stays at work until about 9 PM and states that after the Adderall wears off she is very unmotivated and has difficulty completing the work that needs to be done.  She is sleeping well.  Teresa Hensley denies depression.  She denies hopelessness.  She denies SI/HI.   Teresa Hensley shared that in a way this pandemic has been somewhat therapeutic for her.  She has felt very alone in her physical illness struggles and states that now everyone around her understands and is experiencing the things that she was going through.   Observations/Objective: I spoke with Teresa Hensley [place] on the phone.  Pt was calm, pleasant and cooperative.  Pt was engaged in the  conversation and answered questions appropriately.  Speech was clear and coherent with normal rate, tone and volume.  Mood is euthymic, affect is mildly anxious but significantly brighter and calmer than previous visits. Thought processes are coherent, goal oriented and intact.  Thought content is logical.  Pt denies SI/HI.   Pt denies auditory and visual hallucinations and did not appear to be responding to internal stimuli.  Memory and concentration are good.  Fund of knowledge and use of language are average.  Insight and judgment are fair.  I am unable to comment on psychomotor activity, general appearance, hygiene, or eye contact as I was unable to physically see the patient on the phone.  Vital signs not available since interview conducted virtually.    Assessment and Plan: ADHD-inattentive type; GAD; MDD-recurrent, moderate  Adderall XR 20 mg p.o. daily for ADHD  Start Adderall 20 mg p.o. daily as needed ADHD for evening work days  Patient still has 6 tablets of Xanax 0.5 mg.  I will not refill this medication as the goal has always been to discontinue it.  Follow Up Instructions: In 3 mo or sooner if needed   I discussed the assessment and treatment plan with the patient. The patient was provided an opportunity to ask questions and all were answered. The patient agreed with the plan and demonstrated an understanding of the instructions.   The patient was advised to call back or seek an in-person evaluation if the symptoms worsen or  if the condition fails to improve as anticipated.  I provided 15 minutes of non-face-to-face time during this encounter.   Charlcie Cradle, MD

## 2019-06-15 ENCOUNTER — Emergency Department (HOSPITAL_COMMUNITY)
Admission: EM | Admit: 2019-06-15 | Discharge: 2019-06-15 | Disposition: A | Discharge status: Home / Self Care | Payer: No Typology Code available for payment source | Attending: Emergency Medicine | Admitting: Emergency Medicine

## 2019-06-15 ENCOUNTER — Encounter (HOSPITAL_COMMUNITY): Payer: Self-pay

## 2019-06-15 ENCOUNTER — Other Ambulatory Visit: Payer: Self-pay

## 2019-06-15 DIAGNOSIS — R220 Localized swelling, mass and lump, head: Secondary | ICD-10-CM | POA: Insufficient documentation

## 2019-06-15 DIAGNOSIS — Z79899 Other long term (current) drug therapy: Secondary | ICD-10-CM | POA: Diagnosis not present

## 2019-06-15 NOTE — ED Notes (Signed)
Went to room to assess patient. Pt's BP cuff is on the bed and pt is not in the room or surrounding bathroom. Will check back shortly.

## 2019-06-15 NOTE — ED Triage Notes (Signed)
Pt states that she has had swelling in her cheeks, mostly on the left side. Pt also has pain in her face, in her cheeks and nose, along with redness. Pt states that this has been going on for years. Pt states that she was treated for a parasitic infection, and feels something moving.

## 2019-06-15 NOTE — ED Provider Notes (Signed)
[facility]-EMERGENCY DEPT Provider Note   CSN: [id number] Arrival date & time: 06/15/19  1701     History   Chief Complaint Chief Complaint  Patient presents with  . Facial Swelling    HPI Teresa Hensley is a 43 y.o. female with Hx of Hx of tapeworm infection, Ekbom delusional parasitosis syndrome and generalized anxiety disorder presents with facial swelling. She reports that this has been an ongoing issue with recurrent flares and believes it is from being infested with parasites in her sinuses. Patient reports that she saw an orofacial surgeon a few days ago and had CT sinuses without significant findings. She was given amoxicillin, mupirocin and naproxen. Patient reports that her symptoms are not worsening but also not improving either. She denies any fevers, chills, headaches, visual changes, postnasal drip, sinus pain, rhinorrhea, sore throat, cough, or trouble breathing.      Past Medical History:  Diagnosis Date  . ADHD (attention deficit hyperactivity disorder)   . Anxiety   . Seizures (HCC)    as child  . Tapeworm infection     Patient Active Problem List   Diagnosis Date Noted  . Starvation ketoacidosis 09/10/2018  . Psychiatric illness 09/10/2018    Past Surgical History:  Procedure Laterality Date  . pelvic mass removal    . TONSILLECTOMY       OB History   No obstetric history on file.     Home Medications    Prior to Admission medications   Medication Sig Start Date End Date Taking? Authorizing Provider  ADDERALL 20 MG tablet Take 1 tablet (20 mg total) by mouth daily at 3 pm. 04/23/19 04/22/20  Oletta Darter, MD  ADDERALL 20 MG tablet Take 1 tablet (20 mg total) by mouth daily at 3 pm. 04/23/19 04/22/20  Oletta Darter, MD  ADDERALL 20 MG tablet Take 1 tablet (20 mg total) by mouth daily at 3 pm. 04/23/19 04/22/20  Oletta Darter, MD  ADDERALL XR 20 MG 24 hr capsule Take 1 capsule (20 mg total) by mouth daily. 04/23/19   Oletta Darter, MD  ADDERALL XR 20 MG 24 hr capsule Take 1 capsule (20 mg total) by mouth daily. 04/23/19   Oletta Darter, MD  ADDERALL XR 20 MG 24 hr capsule Take 1 capsule (20 mg total) by mouth daily. 04/23/19   Oletta Darter, MD  albendazole (ALBENZA) 200 MG tablet Take 400 mg by mouth 2 (two) times daily with a meal.    [provider]    Family History No family history on file.  Social History Social History   Tobacco Use  . Smoking status: Never Smoker  . Smokeless tobacco: Never Used  Substance Use Topics  . Alcohol use: Yes    Comment: occasionally  . Drug use: No     Allergies   Tramadol, Codeine, and Lamisil [terbinafine]   Review of Systems Review of Systems  Constitutional: Negative for chills, fatigue and fever.  HENT: Positive for congestion, facial swelling and sinus pressure. Negative for dental problem, drooling, ear discharge, ear pain, hearing loss, mouth sores, nosebleeds, postnasal drip, rhinorrhea, sinus pain, sneezing, sore throat, tinnitus, trouble swallowing and voice change.   Eyes: Negative for photophobia, pain, discharge, itching and visual disturbance.  Respiratory: Negative for cough, chest tightness, shortness of breath, wheezing and stridor.   Cardiovascular: Negative for chest pain and palpitations.  Gastrointestinal: Negative for abdominal pain, blood in stool, diarrhea, nausea and vomiting.  Endocrine: Negative.   Genitourinary: Negative.  Musculoskeletal: Negative.   Skin: Positive for rash.       Facial rash   Allergic/Immunologic: Positive for environmental allergies.       Mold   Neurological: Negative.   Hematological: Negative.    Physical Exam Updated Vital Signs BP (!) 137/96 (BP Location: Right Arm)   Pulse 89   Temp 98.4 F (36.9 C) (Oral)   Resp 18   Ht 5\' 6"  (1.676 m)   Wt 56.7 kg   SpO2 100%   BMI 20.18 kg/m   Physical Exam Constitutional:      General: She is not in acute distress.    Appearance:  Normal appearance. She is not ill-appearing.  HENT:     Head: Normocephalic and atraumatic.     Nose: Nose normal. No congestion or rhinorrhea.     Mouth/Throat:     Mouth: Mucous membranes are moist.     Pharynx: Oropharynx is clear. No oropharyngeal exudate or posterior oropharyngeal erythema.  Eyes:     General: No scleral icterus.    Extraocular Movements: Extraocular movements intact.     Conjunctiva/sclera: Conjunctivae normal.     Pupils: Pupils are equal, round, and reactive to light.  Neck:     Musculoskeletal: Normal range of motion and neck supple. No neck rigidity or muscular tenderness.  Cardiovascular:     Rate and Rhythm: Normal rate and regular rhythm.     Pulses: Normal pulses.     Heart sounds: Normal heart sounds. No murmur. No friction rub. No gallop.   Pulmonary:     Effort: Pulmonary effort is normal. No respiratory distress.     Breath sounds: Normal breath sounds. No stridor. No wheezing, rhonchi or rales.  Abdominal:     General: Bowel sounds are normal. There is no distension.     Palpations: Abdomen is soft.     Tenderness: There is no abdominal tenderness. There is no guarding.  Musculoskeletal: Normal range of motion.  Lymphadenopathy:     Cervical: No cervical adenopathy.  Skin:    General: Skin is warm and dry.     Comments: Erythema present on nose and nasal folds extending to bilateral upper cheeks  Neurological:     General: No focal deficit present.     Mental Status: She is alert and oriented to person, place, and time. Mental status is at baseline.     Sensory: No sensory deficit.     Motor: No weakness.    ED Treatments / Results  Labs (all labs ordered are listed, but only abnormal results are displayed) Labs Reviewed - No data to display  EKG None  Radiology No results found.  Procedures Procedures (including critical care time)  Medications Ordered in ED Medications - No data to display   Initial Impression / Assessment  and Plan / ED Course  I have reviewed the triage vital signs and the nursing notes.  Pertinent labs & imaging results that were available during my care of the patient were reviewed by me and considered in my medical decision making (see chart for details).  Patient is 43yo female presenting to ED with facial swelling and redness on her nose and cheeks that has been recurring for several years. Patient was recently seen by orofacial swelling and started on amoxicillin, naproxen and mupirocin cream. She reports that symptoms are not improving and believes she needs further evaluation of her sinuses via ultrasound and swabbing as she may have a parasitic infection. On examination, there is  slight tenderness to palpation of the maxillary sinuses however, no neurological deficits noted. As patient is already on amoxicillin, this should be effective against any bacterial infection she may have of the sinuses.  Patient does not appear to be in any acute distress and is instructed to refer back to PCP vs orofacial surgeon vs ENT for further management. She is instructed to seek emergent care if symptoms worsen or if she develops severe headache, vision changes, severe eye pain or trouble breathing.   Final Clinical Impressions(s) / ED Diagnoses   Final diagnoses:  None    ED Discharge Orders    None       Harvie Heck, MD 06/15/19 Phineas Real, DO 06/15/19 2010

## 2019-06-15 NOTE — Discharge Instructions (Signed)
Teresa Hensley,  You presented to the ED today for concerns of facial swelling. You were seen by an Teacher, music and started on medications a few days ago. It seems like the facial swelling and rash are not worsening. It may take some time for the medications to work. However, if your symptoms do not improve, please see your primary care doctor or return to the orofacial surgeon.   If your facial swelling worsens, you develop severe eye pain or vision changes, or have trouble breathing, please seek emergent care.

## 2019-06-15 NOTE — ED Notes (Signed)
Pt still not noted to be in the room. Pt is up for discharge. Unable to locate pt and go over discharge papers or obtain discharge vital signs.

## 2019-06-30 ENCOUNTER — Other Ambulatory Visit: Payer: Self-pay | Admitting: Oral Surgery

## 2019-06-30 DIAGNOSIS — B889 Infestation, unspecified: Secondary | ICD-10-CM

## 2019-07-01 ENCOUNTER — Other Ambulatory Visit: Payer: Self-pay | Admitting: Oral Surgery

## 2019-07-03 ENCOUNTER — Ambulatory Visit
Admission: RE | Admit: 2019-07-03 | Discharge: 2019-07-03 | Disposition: A | Discharge status: Home / Self Care | Payer: No Typology Code available for payment source | Source: Ambulatory Visit | Attending: Oral Surgery | Admitting: Oral Surgery

## 2019-07-03 DIAGNOSIS — B889 Infestation, unspecified: Secondary | ICD-10-CM

## 2019-07-06 ENCOUNTER — Other Ambulatory Visit: Payer: No Typology Code available for payment source

## 2019-07-16 ENCOUNTER — Other Ambulatory Visit: Payer: Self-pay

## 2019-07-16 ENCOUNTER — Encounter (HOSPITAL_COMMUNITY): Payer: Self-pay | Admitting: Psychiatry

## 2019-07-16 ENCOUNTER — Ambulatory Visit (INDEPENDENT_AMBULATORY_CARE_PROVIDER_SITE_OTHER): Payer: No Typology Code available for payment source | Admitting: Psychiatry

## 2019-07-16 DIAGNOSIS — F411 Generalized anxiety disorder: Secondary | ICD-10-CM | POA: Diagnosis not present

## 2019-07-16 DIAGNOSIS — F331 Major depressive disorder, recurrent, moderate: Secondary | ICD-10-CM

## 2019-07-16 DIAGNOSIS — F900 Attention-deficit hyperactivity disorder, predominantly inattentive type: Secondary | ICD-10-CM

## 2019-07-16 MED ORDER — ADDERALL XR 20 MG PO CP24: 20.0000 mg | ORAL_CAPSULE | Freq: Every day | ORAL | 0 refills | Status: DC

## 2019-07-16 MED ORDER — ADDERALL 20 MG PO TABS: 20.0000 mg | ORAL_TABLET | Freq: Every day | ORAL | 0 refills | Status: DC

## 2019-07-16 MED ORDER — ALPRAZOLAM 0.5 MG PO TABS: 0.5000 mg | ORAL_TABLET | Freq: Every evening | ORAL | 0 refills | Status: DC | PRN

## 2019-07-16 NOTE — Progress Notes (Signed)
  Virtual Visit via Telephone Note  I connected with Teresa Hensley  on 07/16/19 at  1:30 PM EDT by telephone and verified that I am speaking with the correct person using two identifiers.  Location: Patient: doctor office Provider: office   I discussed the limitations, risks, security and privacy concerns of performing an evaluation and management service by telephone and the availability of in person appointments. I also discussed with the patient that there may be a patient responsible charge related to this service. The patient expressed understanding and agreed to proceed.   History of Present Illness: Pt is currently at the doctor's office getting a COVID test. She is having a panic attack and took 1/2 Xanax tab. Teresa Hensley went to the beach recently and woke up on Saturday with a rash. The rash is spreading so she decided to get tested. She has not had a panic attack for many months. [name] remains very concerned about her health and has a number of somatic complaints. She is also dealing with a number issues with her apartment complex. Work is going well. They have a health inspection coming up which is very stressful. Pt denies problems with sleep. She is taking Melatonin. She only feels depressed when she has her period. ADHD is well controlled. Teresa Hensley will often take Adderall   IR instead of the XR. She still has one Xanax left. She is asking for a few more Xanax as a safety net.     Observations/Objective: I spoke with Teresa Hensley on the phone.  Pt was calm, pleasant and cooperative.  Pt was engaged in the conversation and answered questions appropriately.  Speech was clear and coherent with normal rate, tone and volume.  Mood is  anxious, affect is congruent. Thought processes are coherent, goal oriented and intact.  Thought content is with ruminations.  Pt denies SI/HI.   Pt denies auditory and visual hallucinations and did not appear to be responding to internal stimuli.  Memory and  concentration are good.  Fund of knowledge and use of language are average.  Insight and judgment are fair.  I am unable to comment on psychomotor activity, general appearance, hygiene, or eye contact as I was unable to physically see the patient on the phone.  Vital signs not available since interview conducted virtually.     Assessment and Plan: GAD; ADHD- inattentive type; MDD-recurrent, moderate; r/o Hypocondrisis  Status of current symptoms: worsening anxiety  Adderall XR 20mg  po qD for ADHD  Adderall 20mg  po qD prn for long work days  Xanax 0.5mg  po prn- given 5 tabs and no refills   Follow Up Instructions: In 8-10 weeks or sooner if needed   I discussed the assessment and treatment plan with the patient. The patient was provided an opportunity to ask questions and all were answered. The patient agreed with the plan and demonstrated an understanding of the instructions.   The patient was advised to call back or seek an in-person evaluation if the symptoms worsen or if the condition fails to improve as anticipated.  I provided 30 minutes of non-face-to-face time during this encounter.   Charlcie Cradle, MD

## 2019-08-20 ENCOUNTER — Ambulatory Visit: Payer: No Typology Code available for payment source | Admitting: Podiatry

## 2019-08-27 ENCOUNTER — Ambulatory Visit: Payer: No Typology Code available for payment source | Admitting: Podiatry

## 2019-09-10 ENCOUNTER — Ambulatory Visit: Payer: No Typology Code available for payment source | Admitting: Podiatry

## 2019-10-15 ENCOUNTER — Other Ambulatory Visit: Payer: Self-pay

## 2019-10-15 ENCOUNTER — Ambulatory Visit (INDEPENDENT_AMBULATORY_CARE_PROVIDER_SITE_OTHER): Payer: No Typology Code available for payment source | Admitting: Psychiatry

## 2019-10-15 ENCOUNTER — Encounter (HOSPITAL_COMMUNITY): Payer: Self-pay | Admitting: Psychiatry

## 2019-10-15 ENCOUNTER — Telehealth (HOSPITAL_COMMUNITY): Payer: Self-pay | Admitting: *Deleted

## 2019-10-15 ENCOUNTER — Other Ambulatory Visit (HOSPITAL_COMMUNITY): Payer: Self-pay | Admitting: Psychiatry

## 2019-10-15 DIAGNOSIS — F900 Attention-deficit hyperactivity disorder, predominantly inattentive type: Secondary | ICD-10-CM

## 2019-10-15 DIAGNOSIS — F411 Generalized anxiety disorder: Secondary | ICD-10-CM | POA: Diagnosis not present

## 2019-10-15 DIAGNOSIS — F331 Major depressive disorder, recurrent, moderate: Secondary | ICD-10-CM

## 2019-10-15 DIAGNOSIS — F99 Mental disorder, not otherwise specified: Secondary | ICD-10-CM

## 2019-10-15 MED ORDER — DESVENLAFAXINE SUCCINATE ER 50 MG PO TB24: 50.0000 mg | ORAL_TABLET | Freq: Every day | ORAL | 1 refills | Status: DC

## 2019-10-15 MED ORDER — ESCITALOPRAM OXALATE 10 MG PO TABS: 10.0000 mg | ORAL_TABLET | Freq: Every day | ORAL | 1 refills | Status: DC

## 2019-10-15 MED ORDER — ADDERALL 20 MG PO TABS: 20.0000 mg | ORAL_TABLET | Freq: Every day | ORAL | 0 refills | Status: DC

## 2019-10-15 MED ORDER — ADDERALL XR 20 MG PO CP24: 20.0000 mg | ORAL_CAPSULE | Freq: Every day | ORAL | 0 refills | Status: DC

## 2019-10-15 MED ORDER — ALPRAZOLAM 0.5 MG PO TABS: 0.5000 mg | ORAL_TABLET | Freq: Every evening | ORAL | 1 refills | Status: DC | PRN

## 2019-10-15 NOTE — Progress Notes (Signed)
This note is not being shared with the patient for the following reason: To prevent harm (release of this note would result in harm to the life or physical safety of the patient or another).  Virtual Visit via Telephone Note  I connected with Elwin Mocha  on 10/15/19 at  1:00 PM EST by telephone and verified that I am speaking with the correct person using two identifiers.  Location: Patient: home Provider: office   I discussed the limitations, risks, security and privacy concerns of performing an evaluation and management service by telephone and the availability of in person appointments. I also discussed with the patient that there may be a patient responsible charge related to this service. The patient expressed understanding and agreed to proceed.   History of Present Illness: Shere is having severe anxiety and depression regarding her health. She was diagnosed with tapeworms and is really scared. Winry has not found a well qualified doctor to treat her infections.  She is having some skin issues. Azaya feels like doctors hate her and she is tired of explaining her health issues to people over and over again. It is embarrassing and she feels humiliated and scared. She is depressed and overwhelmed. Today she is home. She woke up in the middle of the night with nausea. Josiephine's grandmother died in 31-Aug-2023 and she has been depressed since then. She has been isolating more. She denies SI/HI.    Observations/Objective: I spoke with Elwin Mocha on the phone.  Pt was  cooperative.  Pt was engaged in the conversation and answered questions appropriately.  Speech was clear and coherent with normal rate, tone and volume.  Mood is depressed and anxious, affect is congruent. Thought processes are coherent and intact.  Thought content is logical.  Pt denies SI/HI.   Pt denies auditory and visual hallucinations and did not appear to be responding to internal stimuli.  Memory and concentration are  good.  Fund of knowledge and use of language are average.  Insight and judgment are fair.  I am unable to comment on psychomotor activity, general appearance, hygiene, or eye contact as I was unable to physically see the patient on the phone.  Vital signs not available since interview conducted virtually.     Assessment and Plan: GAD; ADHD-inattentive type; MDD-recurrent, moderate; r/o Hypocondrisis  Status of current symptoms: worsening depression and anxiety  Adderall XR 20mg  po qD for ADHD  Adderall 20mg  po qD prn long work days  Xanax 0.5mg  po qD prn anxiety- 30 tabs  Start Lexapro 10mg  po qD for MDD and GAD  Follow Up Instructions: In 4 weeks or sooner if needed   I discussed the assessment and treatment plan with the patient. The patient was provided an opportunity to ask questions and all were answered. The patient agreed with the plan and demonstrated an understanding of the instructions.   The patient was advised to call back or seek an in-person evaluation if the symptoms worsen or if the condition fails to improve as anticipated.  I provided 25 minutes of non-face-to-face time during this encounter.   , MD

## 2019-10-15 NOTE — Telephone Encounter (Signed)
Received message from pt requesting to try Pristiq and d/c Lexapro. Pt says she feels Lexapro not helping. Please review.

## 2019-10-15 NOTE — Telephone Encounter (Signed)
done

## 2019-10-15 NOTE — Progress Notes (Signed)
Pt called clinic and stated that Lexapro was not helpful in the past and she would like a trial with Pristiq. I have sent in script for Pristiq.

## 2019-11-14 ENCOUNTER — Other Ambulatory Visit (HOSPITAL_COMMUNITY): Payer: Self-pay | Admitting: Psychiatry

## 2019-11-14 DIAGNOSIS — F331 Major depressive disorder, recurrent, moderate: Secondary | ICD-10-CM

## 2019-11-26 ENCOUNTER — Other Ambulatory Visit: Payer: Self-pay

## 2019-11-26 ENCOUNTER — Ambulatory Visit (INDEPENDENT_AMBULATORY_CARE_PROVIDER_SITE_OTHER): Payer: No Typology Code available for payment source | Admitting: Psychiatry

## 2019-11-26 ENCOUNTER — Encounter (HOSPITAL_COMMUNITY): Payer: Self-pay | Admitting: Psychiatry

## 2019-11-26 DIAGNOSIS — F331 Major depressive disorder, recurrent, moderate: Secondary | ICD-10-CM | POA: Diagnosis not present

## 2019-11-26 DIAGNOSIS — F900 Attention-deficit hyperactivity disorder, predominantly inattentive type: Secondary | ICD-10-CM | POA: Diagnosis not present

## 2019-11-26 DIAGNOSIS — F411 Generalized anxiety disorder: Secondary | ICD-10-CM | POA: Diagnosis not present

## 2019-11-26 MED ORDER — DESVENLAFAXINE SUCCINATE ER 50 MG PO TB24: 50.0000 mg | ORAL_TABLET | Freq: Every day | ORAL | 0 refills | Status: DC

## 2019-11-26 MED ORDER — ADDERALL 20 MG PO TABS: 20.0000 mg | ORAL_TABLET | Freq: Every day | ORAL | 0 refills | Status: DC

## 2019-11-26 MED ORDER — ADDERALL XR 20 MG PO CP24: 20.0000 mg | ORAL_CAPSULE | Freq: Every day | ORAL | 0 refills | Status: DC

## 2019-11-26 NOTE — Progress Notes (Signed)
Virtual Visit via Telephone Note  I connected with Teresa Hensley on 11/26/19 at  3:00 PM EST by telephone and verified that I am speaking with the correct person using two identifiers.  Location: Patient: home Provider: office   I discussed the limitations, risks, security and privacy concerns of performing an evaluation and management service by telephone and the availability of in person appointments. I also discussed with the patient that there may be a patient responsible charge related to this service. The patient expressed understanding and agreed to proceed.   History of Present Illness: Teresa Hensley reports she has ongoing stressors related to her apartment. Teresa Hensley is working on dealing with her health. All of these things contribute to her anxiety. She does not taken Xanax daily and still has about 10 tabs left. The Pristiq is helping with her anxiety. Work is going well and she is taking Adderall. Her depression has significantly improved with Pristiq. She is laughing more. She denies SI/HI.    Observations/Objective:  General Appearance: unable to assess  Eye Contact:  unable to assess  Speech:  Clear and Coherent and Normal Rate  Volume:  Normal  Mood:  Anxious  Affect:  Full Range  Thought Process:  Coherent and Descriptions of Associations: Circumstantial  Orientation:  Full (Time, Place, and Person)  Thought Content:  Paranoid Ideation and Rumination  Suicidal Thoughts:  No  Homicidal Thoughts:  No  Memory:  Immediate;   Good  Judgement:  Fair  Insight:  Fair  Psychomotor Activity:  unable to assess  Concentration:  Concentration: Fair  Recall:  Good  Fund of Knowledge:  Good  Language:  Good  Akathisia:  unable to assess  Handed:  Right  AIMS (if indicated):     Assets:  Communication Skills Desire for Improvement Financial Resources/Insurance Housing Social Support Talents/Skills Transportation Vocational/Educational  ADL's:  Unable to assess  Cognition:  WNL   Sleep:         I reviewed the information below on 11/26/2019 and have updated it Assessment and Plan:GAD; ADHD-inattentive type; MDD-recurrent, moderate; r/o Hypocondrisis   Status of current symptoms: improved anxiety   Adderall XR 20mg  po qD for ADHD   Adderall 20mg  po qD prn long work days   Xanax 0.5mg  po qD prn anxiety- 30 tabs   D/c Lexapro   Pristiq 50mg  po qD for GAD and MDD   Follow Up Instructions: In 6-8 weeks or sooner if needed   I discussed the assessment and treatment plan with the patient. The patient was provided an opportunity to ask questions and all were answered. The patient agreed with the plan and demonstrated an understanding of the instructions.   The patient was advised to call back or seek an in-person evaluation if the symptoms worsen or if the condition fails to improve as anticipated.  I provided 20 minutes of non-face-to-face time during this encounter.   , MD

## 2019-12-02 ENCOUNTER — Telehealth (HOSPITAL_COMMUNITY): Payer: Self-pay | Admitting: *Deleted

## 2019-12-02 NOTE — Telephone Encounter (Signed)
Message left by mother, Darla, concerning pt mood lability and intermittent passive s.i. mother reported that pt "stays in bed all day" and mother is worried. Writer returned mother call. Left VM to call this nurse back, that Dr. Michae Kava is not in office today but that if needed writer could reach out to her or MD that is in office.

## 2019-12-03 NOTE — Telephone Encounter (Signed)
If mom feels that Teresa Hensley is a danger to herself then mom can take her to the ED. Please let me know what the patient says. If needed schedule an appointment with me next week.

## 2019-12-04 NOTE — Telephone Encounter (Signed)
Writer left two messages for pt regarding what mother is reporting (increased depression, lability, anger). This nurse did speak with mother about doing a welfare check or how to obtain IVC as mother says pt won't talk to her right now, or when she does it's "hateful full of curse words". Pt declined information stating "I just can't do that to her". Writer reminded mother this is for pt safety as she reported pt is also expressing passive s.i. mother says pt will never speak to her again if she petitions her. Writer will attempt to contact pt again before leaving office for the weekend.

## 2020-01-04 ENCOUNTER — Telehealth (HOSPITAL_COMMUNITY): Payer: Self-pay

## 2020-01-04 NOTE — Telephone Encounter (Signed)
Patient had called and left a message regarding her Adderall 20mg . She had stated that she was interested in switching from brand to generic due to cost. I spoke with the pharmacy and they stated that her insurance won't cover generic and the brand name is $35. Have tried to reach the patient several times to relay the message but I keep getting her voicemail and I cannot leave a msg due to mailbox full.

## 2020-01-05 ENCOUNTER — Telehealth (HOSPITAL_COMMUNITY): Payer: Self-pay | Admitting: *Deleted
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
## 2020-01-11 ENCOUNTER — Encounter (HOSPITAL_COMMUNITY): Payer: Self-pay | Admitting: *Deleted

## 2020-02-18 ENCOUNTER — Other Ambulatory Visit: Payer: Self-pay

## 2020-02-18 ENCOUNTER — Telehealth (HOSPITAL_COMMUNITY): Payer: No Typology Code available for payment source | Admitting: Psychiatry

## 2020-02-26 ENCOUNTER — Ambulatory Visit: Payer: No Typology Code available for payment source | Admitting: Family Medicine

## 2020-03-24 ENCOUNTER — Telehealth (INDEPENDENT_AMBULATORY_CARE_PROVIDER_SITE_OTHER): Payer: No Typology Code available for payment source | Admitting: Psychiatry

## 2020-03-24 ENCOUNTER — Encounter (HOSPITAL_COMMUNITY): Payer: Self-pay | Admitting: Psychiatry

## 2020-03-24 ENCOUNTER — Other Ambulatory Visit: Payer: Self-pay

## 2020-03-24 DIAGNOSIS — F900 Attention-deficit hyperactivity disorder, predominantly inattentive type: Secondary | ICD-10-CM

## 2020-03-24 DIAGNOSIS — F331 Major depressive disorder, recurrent, moderate: Secondary | ICD-10-CM | POA: Diagnosis not present

## 2020-03-24 DIAGNOSIS — F411 Generalized anxiety disorder: Secondary | ICD-10-CM

## 2020-03-24 MED ORDER — ADDERALL 20 MG PO TABS: 20.0000 mg | ORAL_TABLET | Freq: Every day | ORAL | 0 refills | Status: DC

## 2020-03-24 MED ORDER — AMPHETAMINE-DEXTROAMPHETAMINE 20 MG PO TABS: 20.0000 mg | ORAL_TABLET | Freq: Every day | ORAL | 0 refills | Status: DC

## 2020-03-24 MED ORDER — ADDERALL XR 20 MG PO CP24: 20.0000 mg | ORAL_CAPSULE | Freq: Every day | ORAL | 0 refills | Status: DC

## 2020-03-24 MED ORDER — ALPRAZOLAM 0.5 MG PO TABS: 0.5000 mg | ORAL_TABLET | Freq: Every evening | ORAL | 1 refills | Status: DC | PRN

## 2020-03-24 MED ORDER — DESVENLAFAXINE SUCCINATE ER 100 MG PO TB24: 100.0000 mg | ORAL_TABLET | Freq: Every day | ORAL | 2 refills | Status: DC
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
## 2020-06-16 ENCOUNTER — Telehealth (HOSPITAL_COMMUNITY): Payer: No Typology Code available for payment source | Admitting: Psychiatry

## 2020-06-29 ENCOUNTER — Ambulatory Visit: Payer: No Typology Code available for payment source | Admitting: Family Medicine

## 2020-07-14 ENCOUNTER — Other Ambulatory Visit: Payer: Self-pay

## 2020-07-14 ENCOUNTER — Telehealth (HOSPITAL_COMMUNITY): Payer: No Typology Code available for payment source | Admitting: Psychiatry

## 2020-07-14 DIAGNOSIS — F900 Attention-deficit hyperactivity disorder, predominantly inattentive type: Secondary | ICD-10-CM

## 2020-07-14 DIAGNOSIS — F331 Major depressive disorder, recurrent, moderate: Secondary | ICD-10-CM

## 2020-07-14 DIAGNOSIS — F411 Generalized anxiety disorder: Secondary | ICD-10-CM

## 2020-07-14 MED ORDER — DESVENLAFAXINE SUCCINATE ER 100 MG PO TB24: 100.0000 mg | ORAL_TABLET | Freq: Every day | ORAL | 2 refills | Status: DC

## 2020-07-14 MED ORDER — AMPHETAMINE-DEXTROAMPHETAMINE 20 MG PO TABS: 20.0000 mg | ORAL_TABLET | Freq: Every day | ORAL | 0 refills | Status: DC

## 2020-07-14 MED ORDER — AMPHETAMINE-DEXTROAMPHET ER 30 MG PO CP24: 30.0000 mg | ORAL_CAPSULE | Freq: Every day | ORAL | 0 refills | Status: DC

## 2020-07-14 MED ORDER — ALPRAZOLAM 0.5 MG PO TABS: 0.5000 mg | ORAL_TABLET | Freq: Every evening | ORAL | 1 refills | Status: DC | PRN

## 2020-07-14 NOTE — Progress Notes (Unsigned)
Virtual Visit via Telephone Note  I connected with Teresa Hensley on 07/14/20 at  2:45 PM EDT by telephone and verified that I am speaking with the correct person using two identifiers.  Location: Patient: home Provider: office   I discussed the limitations, risks, security and privacy concerns of performing an evaluation and management service by telephone and the availability of in person appointments. I also discussed with the patient that there may be a patient responsible charge related to this service. The patient expressed understanding and agreed to proceed.   History of Present Illness: Teresa Hensley states her health issues continue. She has a diffuse rash over the chest. She went to her PCP and is now on Doxycyline. She is tired and is frustrated with these problems. Teresa Hensley feels the Pristiq helps with panic attacks and anxiety. She often feels she can't leave her house due to her health. Teresa Hensley is trying to keep a positive outlook and hoping that her body will heal itself. She is still dealing with her apartment issues. The depression is situational and she is able to pull herself out it quickly. Teresa Hensley to go up to Adderall 30mg  to help her focus, motivation and be more productive. Teresa Hensley takes the 20mg  dose randomly when she has long days.  She denies SI/HI.    Observations/Objective:  General Appearance: unable to assess  Eye Contact:  unable to assess  Speech:  {Speech:22685}  Volume:  {Volume (PAA):22686}  Mood:  {BHH MOOD:22306}  Affect:  {Affect (PAA):22687}  Thought Process:  {Thought Process (PAA):22688}  Orientation:  {BHH ORIENTATION (PAA):22689}  Thought Content:  {Thought Content:22690}  Suicidal Thoughts:  {ST/HT (PAA):22692}  Homicidal Thoughts:  {ST/HT (PAA):22692}  Memory:  {BHH MEMORY:22881}  Judgement:  {Judgement (PAA):22694}  Insight:  {Insight (PAA):22695}  Psychomotor Activity: unable to assess  Concentration:  {Concentration:21399}  Recall:  {BHH  GOOD/FAIR/POOR:22877}  Fund of Knowledge:  {BHH GOOD/FAIR/POOR:22877}  Language:  {BHH GOOD/FAIR/POOR:22877}  Akathisia:  unable to assess  Handed:  {Handed:22697}  AIMS (if indicated):     Assets:  {Assets (PAA):22698}  ADL's:  unable to assess  Cognition:  {chl bhh cognition:304700322}  Sleep:         Assessment and Plan:  GAD; ADHD- inattentive type; MDD- recurrent, moderate; r/o Hypochondriasis  Increase Adderall XR 30mg  po qD for ADHD  Adderall 20mg  po qD prn long work days  Xanax 0.5mg  po qD prn anxiety  Pristiq 50mg  po qD for GAD and MDD   Follow Up Instructions: In 2-3 months or sooner if needed   I discussed the assessment and treatment plan with the patient. The patient was provided an opportunity to ask questions and all were answered. The patient agreed with the plan and demonstrated an understanding of the instructions.   The patient was advised to call back or seek an in-person evaluation if the symptoms worsen or if the condition fails to improve as anticipated.  I provided 15 minutes of non-face-to-face time during this  encounter.   Teresa Fail, MD

## 2020-08-11 ENCOUNTER — Other Ambulatory Visit (HOSPITAL_COMMUNITY): Payer: Self-pay | Admitting: Psychiatry

## 2020-08-11 DIAGNOSIS — F411 Generalized anxiety disorder: Secondary | ICD-10-CM

## 2020-08-11 DIAGNOSIS — F331 Major depressive disorder, recurrent, moderate: Secondary | ICD-10-CM

## 2020-10-13 ENCOUNTER — Telehealth (HOSPITAL_COMMUNITY): Payer: No Typology Code available for payment source | Admitting: Psychiatry

## 2020-10-17 ENCOUNTER — Telehealth (HOSPITAL_COMMUNITY): Payer: Self-pay | Admitting: *Deleted

## 2020-10-17 NOTE — Telephone Encounter (Signed)
Patient needs refill of Pristig.  Nex appt is 11/10/20. Please review.

## 2020-10-19 ENCOUNTER — Telehealth (HOSPITAL_COMMUNITY): Payer: Self-pay | Admitting: *Deleted

## 2020-10-19 NOTE — Telephone Encounter (Signed)
Received second request for refill on the Pristiq 100 mg. Last written 07/14/20 with 2 fills. Pt appointment not until 11/10/20. Ok to fill? Thanks.

## 2020-10-20 ENCOUNTER — Other Ambulatory Visit (HOSPITAL_COMMUNITY): Payer: Self-pay | Admitting: Psychiatry

## 2020-10-20 DIAGNOSIS — F331 Major depressive disorder, recurrent, moderate: Secondary | ICD-10-CM

## 2020-10-20 DIAGNOSIS — F411 Generalized anxiety disorder: Secondary | ICD-10-CM

## 2020-10-20 MED ORDER — DESVENLAFAXINE SUCCINATE ER 100 MG PO TB24: 100.0000 mg | ORAL_TABLET | Freq: Every day | ORAL | 2 refills | Status: DC

## 2020-10-20 NOTE — Telephone Encounter (Signed)
done

## 2020-11-02 ENCOUNTER — Telehealth (HOSPITAL_COMMUNITY): Payer: Self-pay | Admitting: *Deleted

## 2020-11-02 ENCOUNTER — Ambulatory Visit: Payer: No Typology Code available for payment source | Admitting: Family Medicine
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
## 2020-11-03 NOTE — Telephone Encounter (Signed)
She doesn't answer and her VM is full.

## 2020-11-03 NOTE — Telephone Encounter (Signed)
Ok maybe call patient today and check in with her.

## 2020-11-10 ENCOUNTER — Telehealth (HOSPITAL_COMMUNITY): Payer: No Typology Code available for payment source | Admitting: Psychiatry

## 2020-12-15 ENCOUNTER — Telehealth (HOSPITAL_COMMUNITY): Payer: No Typology Code available for payment source | Admitting: Psychiatry

## 2020-12-15 NOTE — Telephone Encounter (Signed)
Please contact the patient to set up an appointment

## 2020-12-29 ENCOUNTER — Telehealth (INDEPENDENT_AMBULATORY_CARE_PROVIDER_SITE_OTHER): Payer: No Typology Code available for payment source | Admitting: Psychiatry

## 2020-12-29 ENCOUNTER — Other Ambulatory Visit: Payer: Self-pay

## 2020-12-29 DIAGNOSIS — F411 Generalized anxiety disorder: Secondary | ICD-10-CM | POA: Diagnosis not present

## 2020-12-29 DIAGNOSIS — F331 Major depressive disorder, recurrent, moderate: Secondary | ICD-10-CM | POA: Diagnosis not present

## 2020-12-29 DIAGNOSIS — F515 Nightmare disorder: Secondary | ICD-10-CM

## 2020-12-29 MED ORDER — PRAZOSIN HCL 1 MG PO CAPS: 1.0000 mg | ORAL_CAPSULE | Freq: Two times a day (BID) | ORAL | 1 refills | Status: DC

## 2020-12-29 MED ORDER — ALPRAZOLAM 0.5 MG PO TABS: 0.5000 mg | ORAL_TABLET | Freq: Every evening | ORAL | 1 refills | Status: DC | PRN

## 2020-12-29 MED ORDER — DESVENLAFAXINE SUCCINATE ER 100 MG PO TB24: 100.0000 mg | ORAL_TABLET | Freq: Every day | ORAL | 1 refills | Status: DC

## 2020-12-29 NOTE — Progress Notes (Signed)
Virtual Visit via Telephone Note  I connected with Teresa Hensley on 12/29/20 at  3:30 PM EDT by telephone and verified that I am speaking with the correct person using two identifiers. Teresa Hensley refused to do a Architectural technologist because she is sick in bed.  Location: Patient: home Provider: office   I discussed the limitations, risks, security and privacy concerns of performing an evaluation and management service by telephone and the availability of in person appointments. I also discussed with the patient that there may be a patient responsible charge related to this service. The patient expressed understanding and agreed to proceed.   History of Present Illness: For the last 2 years she has been sick. In the past 3 months the feeling that something foreign in her mouth is going away. She has been 3 oral surgeons and no one is helping. Teresa Hensley feels she needs to go to the hospital but is terrified. Teresa Hensley has taken Xanax a few times and still has some since it was last filled. She has not left her apartment in the 3 weeks. Her mom is lying to her. Teresa Hensley is still working with an attorney regarding her apartment with the mold. Teresa Hensley is overwhelmed and stressed. She feels no support from her friends and family. Teresa Hensley has been to numerous doctors but no one is able to help or seems willing to help. She is not sleeping well and she has night sweets. She is having nightmares. Teresa Hensley is having near daily crying spells. Her energy is low. She is trying herbs and massages but notes that some weird things come out of her skin. That only makes her more anxious. She wants wants an MRI to see what shows up. She has passive thoughts of death but denies SI/HI. Teresa Hensley has not been taking Adderall since she is not doing much. She has taken the IR form a few times but it has been over a month since she took the XR form.    Observations/Objective:  General Appearance: unable to assess  Eye Contact:  unable to assess  Speech:   Clear and Coherent and Normal Rate  Volume:  Normal  Mood:  Anxious and Depressed  Affect:  Congruent  Thought Process:  Coherent and Descriptions of Associations: Intact  Orientation:  Full (Time, Place, and Person)  Thought Content:  Rumination  Suicidal Thoughts:  No  Homicidal Thoughts:  No  Memory:  Immediate;   Good  Judgement:  Good  Insight:  Fair  Psychomotor Activity: unable to assess  Concentration:  Concentration: Good  Recall:  Good  Fund of Knowledge:  Good  Language:  Good  Akathisia:  unable to assess  Handed:  Right  AIMS (if indicated):     Assets:  Communication Skills Desire for Improvement Financial Resources/Insurance Housing Talents/Skills Transportation Vocational/Educational  ADL's:  unable to assess  Cognition:  WNL  Sleep:         Assessment and Plan: 1. GAD (generalized anxiety disorder) - desvenlafaxine (PRISTIQ) 100 MG 24 hr tablet; Take 1 tablet (100 mg total) by mouth daily.  Dispense: 30 tablet; Refill: 1 - ALPRAZolam (XANAX) 0.5 MG tablet; Take 1 tablet (0.5 mg total) by mouth at bedtime as needed for anxiety.  Dispense: 30 tablet; Refill: 1  2. MDD (major depressive disorder), recurrent episode, moderate (HCC) - desvenlafaxine (PRISTIQ) 100 MG 24 hr tablet; Take 1 tablet (100 mg total) by mouth daily.  Dispense: 30 tablet; Refill: 1  3. Nightmares - start prazosin (MINIPRESS)  1 MG capsule; Take 1 capsule (1 mg total) by mouth 2 (two) times daily.  Dispense: 60 capsule; Refill: 1    Teresa Hensley wants to explore therapy in the future.     Depression screen [facility] 2/9 12/29/2020  Decreased Interest 3  Down, Depressed, Hopeless 3  PHQ - 2 Score 6  Altered sleeping 2  Tired, decreased energy 3  Change in appetite 2  Feeling bad or failure about yourself  3  Trouble concentrating 0  Moving slowly or fidgety/restless 0  Suicidal thoughts 3  PHQ-9 Score 19  Difficult doing work/chores Extremely dIfficult   Flowsheet Row Video Visit  from 12/29/2020 in [facility]  C-SSRS RISK CATEGORY Error: Q3, 4, or 5 should not be populated when Q2 is No      Follow Up Instructions: In 4-8 weeks or sooner if needed   I discussed the assessment and treatment plan with the patient. The patient was provided an opportunity to ask questions and all were answered. The patient agreed with the plan and demonstrated an understanding of the instructions.   The patient was advised to call back or seek an in-person evaluation if the symptoms worsen or if the condition fails to improve as anticipated.  I provided 25 minutes of non-face-to-face time during this encounter.   Teresa Darter, MD

## 2021-01-04 ENCOUNTER — Other Ambulatory Visit: Payer: Self-pay

## 2021-01-04 ENCOUNTER — Encounter (HOSPITAL_COMMUNITY): Payer: Self-pay | Admitting: Student

## 2021-01-04 ENCOUNTER — Emergency Department (HOSPITAL_COMMUNITY)
Admission: EM | Admit: 2021-01-04 | Discharge: 2021-01-04 | Disposition: A | Discharge status: Home / Self Care | Payer: No Typology Code available for payment source | Attending: Emergency Medicine | Admitting: Emergency Medicine

## 2021-01-04 DIAGNOSIS — R002 Palpitations: Secondary | ICD-10-CM | POA: Insufficient documentation

## 2021-01-04 DIAGNOSIS — R519 Headache, unspecified: Secondary | ICD-10-CM | POA: Diagnosis not present

## 2021-01-04 DIAGNOSIS — E860 Dehydration: Secondary | ICD-10-CM

## 2021-01-04 DIAGNOSIS — R6884 Jaw pain: Secondary | ICD-10-CM | POA: Diagnosis not present

## 2021-01-04 DIAGNOSIS — R000 Tachycardia, unspecified: Secondary | ICD-10-CM | POA: Insufficient documentation

## 2021-01-04 DIAGNOSIS — R112 Nausea with vomiting, unspecified: Secondary | ICD-10-CM | POA: Diagnosis present

## 2021-01-04 LAB — URINALYSIS, ROUTINE W REFLEX MICROSCOPIC
Bilirubin Urine: NEGATIVE
Glucose, UA: NEGATIVE mg/dL
Ketones, ur: 80 mg/dL — AB
Leukocytes,Ua: NEGATIVE
Nitrite: NEGATIVE
Protein, ur: NEGATIVE mg/dL
Specific Gravity, Urine: 1.019 (ref 1.005–1.030)
pH: 5 (ref 5.0–8.0)

## 2021-01-04 LAB — CBC WITH DIFFERENTIAL/PLATELET
Abs Immature Granulocytes: 0.05 10*3/uL (ref 0.00–0.07)
Basophils Absolute: 0 10*3/uL (ref 0.0–0.1)
Basophils Relative: 0 %
Eosinophils Absolute: 0 10*3/uL (ref 0.0–0.5)
Eosinophils Relative: 0 %
HCT: 38.6 % (ref 36.0–46.0)
Hemoglobin: 12.6 g/dL (ref 12.0–15.0)
Immature Granulocytes: 0 %
Lymphocytes Relative: 9 %
Lymphs Abs: 1.1 10*3/uL (ref 0.7–4.0)
MCH: 31 pg (ref 26.0–34.0)
MCHC: 32.6 g/dL (ref 30.0–36.0)
MCV: 94.8 fL (ref 80.0–100.0)
Monocytes Absolute: 0.5 10*3/uL (ref 0.1–1.0)
Monocytes Relative: 4 %
Neutro Abs: 10.3 10*3/uL — ABNORMAL HIGH (ref 1.7–7.7)
Neutrophils Relative %: 87 %
Platelets: 385 10*3/uL (ref 150–400)
RBC: 4.07 MIL/uL (ref 3.87–5.11)
RDW: 12.7 % (ref 11.5–15.5)
WBC: 12 10*3/uL — ABNORMAL HIGH (ref 4.0–10.5)
nRBC: 0 % (ref 0.0–0.2)

## 2021-01-04 LAB — COMPREHENSIVE METABOLIC PANEL
ALT: 34 U/L (ref 0–44)
AST: 32 U/L (ref 15–41)
Albumin: 4.7 g/dL (ref 3.5–5.0)
Alkaline Phosphatase: 59 U/L (ref 38–126)
Anion gap: 24 — ABNORMAL HIGH (ref 5–15)
BUN: 9 mg/dL (ref 6–20)
CO2: 11 mmol/L — ABNORMAL LOW (ref 22–32)
Calcium: 8.7 mg/dL — ABNORMAL LOW (ref 8.9–10.3)
Chloride: 104 mmol/L (ref 98–111)
Creatinine, Ser: 0.77 mg/dL (ref 0.44–1.00)
GFR, Estimated: >60 mL/min (ref >60)
Glucose, Bld: 75 mg/dL (ref 70–99)
Potassium: 3.8 mmol/L (ref 3.5–5.1)
Sodium: 139 mmol/L (ref 135–145)
Total Bilirubin: 0.7 mg/dL (ref 0.3–1.2)
Total Protein: 7.4 g/dL (ref 6.5–8.1)

## 2021-01-04 LAB — LIPASE, BLOOD: Lipase: 26 U/L (ref 11–51)

## 2021-01-04 LAB — I-STAT BETA HCG BLOOD, ED (MC, WL, AP ONLY): I-stat hCG, quantitative: <5 m[IU]/mL (ref <5)

## 2021-01-04 LAB — MAGNESIUM: Magnesium: 1.7 mg/dL (ref 1.7–2.4)

## 2021-01-04 MED ORDER — PANTOPRAZOLE SODIUM 40 MG PO TBEC
40.0000 mg | DELAYED_RELEASE_TABLET | Freq: Once | ORAL | Status: AC
  Administered 2021-01-04: 40 mg via ORAL
  Filled 2021-01-04: qty 1

## 2021-01-04 MED ORDER — LORAZEPAM 2 MG/ML IJ SOLN
1.0000 mg | Freq: Once | INTRAMUSCULAR | Status: AC
  Administered 2021-01-04: 1 mg via INTRAVENOUS
  Filled 2021-01-04: qty 1

## 2021-01-04 MED ORDER — SODIUM CHLORIDE 0.9 % IV BOLUS
1000.0000 mL | Freq: Once | INTRAVENOUS | Status: AC
  Administered 2021-01-04: 1000 mL via INTRAVENOUS

## 2021-01-04 MED ORDER — LIDOCAINE VISCOUS HCL 2 % MT SOLN
15.0000 mL | Freq: Once | OROMUCOSAL | Status: AC
  Administered 2021-01-04: 15 mL via ORAL
  Filled 2021-01-04: qty 15

## 2021-01-04 MED ORDER — PANTOPRAZOLE SODIUM 20 MG PO TBEC: 20.0000 mg | DELAYED_RELEASE_TABLET | Freq: Every day | ORAL | 0 refills | Status: DC

## 2021-01-04 MED ORDER — ALUM & MAG HYDROXIDE-SIMETH 200-200-20 MG/5ML PO SUSP
30.0000 mL | Freq: Once | ORAL | Status: AC
  Administered 2021-01-04: 30 mL via ORAL
  Filled 2021-01-04: qty 30

## 2021-01-04 NOTE — ED Triage Notes (Signed)
BIB [facility] from home. Hx: anxiety. C/o nausea and vomiting for past two hours. Feels like heart is racing. Also dental infection x1 year.   138/86 116 heart rate  22 RR 100% CBG 75

## 2021-01-04 NOTE — ED Provider Notes (Signed)
Received signout from the previous provider, please see her note for complete H&P.  In short this is a 45 year old female presenting complaining of nausea and vomiting since last night.  Labs were ordered, IV fluid given.  EKG remarkable for prolonged QT.  Pregnancy test is negative, electrolyte panels are reassuring, mild elevated white count of 12 but normal H&H, normal lipase, UA with moderate hemoglobin and given this takes an 80 ketones but no signs of UTI.  Normal magnesium level.  11:09 AM Patient received 2 L of IV fluid.  Was also given food and drink was able to keep it down.  She did experience some heartburn symptoms improved with GI cocktail and Protonix.  Patient discharged home with outpatient follow-up recommendation.  Return precaution given.  BP 118/76   Pulse (!) 103   Temp 98.3 F (36.8 C) (Oral)   Resp (!) 24   Ht 5\' 6"  (1.676 m)   Wt 59 kg   SpO2 100%   BMI 20.98 kg/m   Results for orders placed or performed during the hospital encounter of 01/04/21  Comprehensive metabolic panel  Result Value Ref Range   Sodium 139 135 - 145 mmol/L   Potassium 3.8 3.5 - 5.1 mmol/L   Chloride 104 98 - 111 mmol/L   CO2 11 (L) 22 - 32 mmol/L   Glucose, Bld 75 70 - 99 mg/dL   BUN 9 6 - 20 mg/dL   Creatinine, Ser 01/06/21 0.44 - 1.00 mg/dL   Calcium 8.7 (L) 8.9 - 10.3 mg/dL   Total Protein 7.4 6.5 - 8.1 g/dL   Albumin 4.7 3.5 - 5.0 g/dL   AST 32 15 - 41 U/L   ALT 34 0 - 44 U/L   Alkaline Phosphatase 59 38 - 126 U/L   Total Bilirubin 0.7 0.3 - 1.2 mg/dL   GFR, Estimated 7.82 >42 mL/min   Anion gap 24 (H) 5 - 15  CBC with Differential  Result Value Ref Range   WBC 12.0 (H) 4.0 - 10.5 K/uL   RBC 4.07 3.87 - 5.11 MIL/uL   Hemoglobin 12.6 12.0 - 15.0 g/dL   HCT >35 36.1 - 44.3 %   MCV 94.8 80.0 - 100.0 fL   MCH 31.0 26.0 - 34.0 pg   MCHC 32.6 30.0 - 36.0 g/dL   RDW 15.4 00.8 - 67.6 %   Platelets 385 150 - 400 K/uL   nRBC 0.0 0.0 - 0.2 %   Neutrophils Relative % 87 %   Neutro  Abs 10.3 (H) 1.7 - 7.7 K/uL   Lymphocytes Relative 9 %   Lymphs Abs 1.1 0.7 - 4.0 K/uL   Monocytes Relative 4 %   Monocytes Absolute 0.5 0.1 - 1.0 K/uL   Eosinophils Relative 0 %   Eosinophils Absolute 0.0 0.0 - 0.5 K/uL   Basophils Relative 0 %   Basophils Absolute 0.0 0.0 - 0.1 K/uL   Immature Granulocytes 0 %   Abs Immature Granulocytes 0.05 0.00 - 0.07 K/uL  Lipase, blood  Result Value Ref Range   Lipase 26 11 - 51 U/L  Urinalysis, Routine w reflex microscopic Urine, Clean Catch  Result Value Ref Range   Color, Urine YELLOW YELLOW   APPearance CLEAR CLEAR   Specific Gravity, Urine 1.019 1.005 - 1.030   pH 5.0 5.0 - 8.0   Glucose, UA NEGATIVE NEGATIVE mg/dL   Hgb urine dipstick MODERATE (A) NEGATIVE   Bilirubin Urine NEGATIVE NEGATIVE   Ketones, ur 80 (A) NEGATIVE  mg/dL   Protein, ur NEGATIVE NEGATIVE mg/dL   Nitrite NEGATIVE NEGATIVE   Leukocytes,Ua NEGATIVE NEGATIVE   RBC / HPF 0-5 0 - 5 RBC/hpf   WBC, UA 0-5 0 - 5 WBC/hpf   Bacteria, UA RARE (A) NONE SEEN   Squamous Epithelial / LPF 0-5 0 - 5   Mucus PRESENT   Magnesium  Result Value Ref Range   Magnesium 1.7 1.7 - 2.4 mg/dL  I-Stat beta hCG blood, ED  Result Value Ref Range   I-stat hCG, quantitative <5.0 <5 mIU/mL   Comment 3           No results found.    Fayrene Helper, PA-C 01/04/21 1109    Benjiman Core, MD 01/04/21 [phone]

## 2021-01-04 NOTE — ED Provider Notes (Signed)
[facility] EMERGENCY DEPARTMENT Provider Note   CSN: [id number] Arrival date & time: 01/04/21  7408     History Chief Complaint  Patient presents with  . Nausea  . Dental Pain    KERAH HARDEBECK is a 45 y.o. female with a history of anxiety, depression, ADHD, and prior starvation ketoacidosis who presents to the emergency department via EMS with complaints of nausea and vomiting that began at 9 PM last night.  Patient states that she has been persistently nauseated, she has had 2 episodes of emesis but has had too numerous to count episodes of dry heaving.  She is unable to keep anything down.  She has had associated palpitations with this.  She denies fever, chest pain, dyspnea, abdominal pain, diarrhea, melena, hematochezia, constipation, dysuria, or vaginal discharge.  She is currently menstruating.  She denies drug or alcohol use.  She denies any suspicious p.o. intake yesterday.  She also mentions that she has had pain to her bilateral jaw/facial pain with discoloration to the mucosa when evaluated under a microscope for the past 2 years.  She is currently having pain to this location that is moderate in severity.  No acute change today. Sees outpatient providers for this problem.   HPI     Past Medical History:  Diagnosis Date  . ADHD (attention deficit hyperactivity disorder)   . Anxiety   . Depression   . Seizures (HCC)    as child  . Tapeworm infection   . Tapeworm infection     Patient Active Problem List   Diagnosis Date Noted  . Starvation ketoacidosis 09/10/2018  . Psychiatric illness 09/10/2018    Past Surgical History:  Procedure Laterality Date  . pelvic mass removal    . TONSILLECTOMY       OB History   No obstetric history on file.     History reviewed. No pertinent family history.  Social History   Tobacco Use  . Smoking status: Never Smoker  . Smokeless tobacco: Never Used  Vaping Use  . Vaping Use: Never used   Substance Use Topics  . Alcohol use: Yes    Comment: occasionally  . Drug use: No    Home Medications Prior to Admission medications   Medication Sig Start Date End Date Taking? Authorizing Provider  ALPRAZolam Prudy Feeler) 0.5 MG tablet Take 1 tablet (0.5 mg total) by mouth at bedtime as needed for anxiety. 12/29/20   Oletta Darter, MD  amphetamine-dextroamphetamine (ADDERALL XR) 30 MG 24 hr capsule Take 1 capsule (30 mg total) by mouth daily. Patient not taking: Reported on 12/29/2020 07/14/20   Oletta Darter, MD  amphetamine-dextroamphetamine (ADDERALL XR) 30 MG 24 hr capsule Take 1 capsule (30 mg total) by mouth daily. Patient not taking: Reported on 12/29/2020 07/14/20   Oletta Darter, MD  amphetamine-dextroamphetamine (ADDERALL XR) 30 MG 24 hr capsule Take 1 capsule (30 mg total) by mouth daily. Patient not taking: Reported on 12/29/2020 07/14/20   Oletta Darter, MD  amphetamine-dextroamphetamine (ADDERALL) 20 MG tablet Take 1 tablet (20 mg total) by mouth daily in the afternoon. 07/14/20 07/14/21  Oletta Darter, MD  amphetamine-dextroamphetamine (ADDERALL) 20 MG tablet Take 1 tablet (20 mg total) by mouth daily in the afternoon. 07/14/20 07/14/21  Oletta Darter, MD  amphetamine-dextroamphetamine (ADDERALL) 20 MG tablet Take 1 tablet (20 mg total) by mouth daily in the afternoon. 07/14/20 07/14/21  Oletta Darter, MD  desvenlafaxine (PRISTIQ) 100 MG 24 hr tablet Take 1 tablet (100 mg total) by  mouth daily. 12/29/20   Oletta Darter, MD  Doxycycline Hyclate 50 MG TABS Take by mouth. Patient not taking: Reported on 12/29/2020    [provider]  prazosin (MINIPRESS) 1 MG capsule Take 1 capsule (1 mg total) by mouth 2 (two) times daily. 12/29/20   Oletta Darter, MD    Allergies    Tramadol, Codeine, and Lamisil [terbinafine]  Review of Systems   Review of Systems  Constitutional: Negative for fever.  HENT:       Positive for dental/jaw pain.   Respiratory: Negative for  shortness of breath.   Cardiovascular: Positive for palpitations. Negative for chest pain.  Gastrointestinal: Positive for nausea and vomiting. Negative for abdominal pain, blood in stool, constipation and diarrhea.  Genitourinary: Positive for vaginal bleeding (menstruating). Negative for dysuria and vaginal discharge.  Neurological: Negative for syncope.  All other systems reviewed and are negative.   Physical Exam Updated Vital Signs BP 125/89 (BP Location: Right Arm)   Pulse (!) 114   Temp 98.3 F (36.8 C) (Oral)   Resp (!) 22   Ht 5\' 6"  (1.676 m)   Wt 59 kg   SpO2 100%   BMI 20.98 kg/m   Physical Exam Vitals and nursing note reviewed.  Constitutional:      General: She is not in acute distress.    Appearance: She is well-developed. She is not toxic-appearing.  HENT:     Head: Normocephalic and atraumatic.     Right Ear: Tympanic membrane is not perforated, erythematous, retracted or bulging.     Left Ear: Tympanic membrane is not perforated, erythematous, retracted or bulging.     Nose: Nose normal.     Mouth/Throat:     Mouth: Mucous membranes are dry.     Pharynx: Uvula midline. No oropharyngeal exudate, posterior oropharyngeal erythema or uvula swelling.     Tonsils: No tonsillar abscesses.     Comments: No obvious gingival swelling, discoloration, or fluctuance.  No gross abscess noted. Posterior oropharynx is symmetric appearing. Patient tolerating own secretions without difficulty. No trismus. No drooling. No hot potato voice. No swelling beneath the tongue, submandibular compartment is soft.  Mild tenderness to the TMJ area bilaterally. No overlying skin changes, to significant erythema/warmth/induration/fluctuance noted.  Eyes:     General:        Right eye: No discharge.        Left eye: No discharge.     Conjunctiva/sclera: Conjunctivae normal.  Cardiovascular:     Rate and Rhythm: Regular rhythm. Tachycardia present.  Pulmonary:     Effort: Pulmonary  effort is normal. No respiratory distress.     Breath sounds: Normal breath sounds. No wheezing, rhonchi or rales.  Abdominal:     General: There is no distension.     Palpations: Abdomen is soft.     Tenderness: There is no abdominal tenderness. There is no guarding or rebound.  Musculoskeletal:     Cervical back: Normal range of motion and neck supple.  Lymphadenopathy:     Cervical: No cervical adenopathy.  Skin:    General: Skin is warm and dry.     Findings: No rash.  Neurological:     Mental Status: She is alert.     Comments: Clear speech.   Psychiatric:        Mood and Affect: Mood is anxious.        Behavior: Behavior normal.        Thought Content: Thought content normal.  ED Results / Procedures / Treatments   Labs (all labs ordered are listed, but only abnormal results are displayed) Labs Reviewed  COMPREHENSIVE METABOLIC PANEL  CBC WITH DIFFERENTIAL/PLATELET  LIPASE, BLOOD  URINALYSIS, ROUTINE W REFLEX MICROSCOPIC  MAGNESIUM  I-STAT BETA HCG BLOOD, ED ([facility], [facility], [facility] ONLY)    EKG EKG Interpretation  Date/Time:  Wednesday January 04 2021 06:04:50 EDT Ventricular Rate:  111 PR Interval:  101 QRS Duration: 101 QT Interval:  387 QTC Calculation: 526 R Axis:   73 Text Interpretation: Sinus tachycardia Minimal ST depression, inferior leads Prolonged QT interval When compared with ECG of 09/10/2018, QT has lengthened Confirmed by Dione Booze ([id number]) on 01/04/2021 6:09:44 AM   Radiology No results found.  Procedures Procedures   Medications Ordered in ED Medications  sodium chloride 0.9 % bolus 1,000 mL (has no administration in time range)    ED Course  I have reviewed the triage vital signs and the nursing notes.  Pertinent labs & imaging results that were available during my care of the patient were reviewed by me and considered in my medical decision making (see chart for details).    MDM Rules/Calculators/A&P                         Patient  presents to the ED with complaints of nausea and vomiting. On arrival patient is tachycardic, vitals are otherwise fairly unremarkable. She seems a bit anxious.  Mucous membranes are bit dry.  Abdomen is nontender without peritoneal signs.  She also mentions her chronic complaint of jaw/facial discomfort which has not acutely changed. No obvious dental abscess noted, exam does not appear consistent with Ludwig's angina.    Plan for EKG given her palpitations as well as left further evaluation. 1L fluids ordered.  EKG: QTc 526, will proceed with ativan for N/V given prolonged QTc, will also check magnesium level.   Additional history obtained:  Additional history obtained from chart review & nursing note review.  Admitted for starvation ketoacidosis in December 2019.  Lab Tests:  I Ordered laboratory testing including pregnancy test, CBC, CMP, lipase, UA, magnesium.  Pregnancy test: Negative.   ED Course:  06:30: Patient care signed out to Fayrene Helper PA-C at change of shift pending remaining work-up and disposition.   Portions of this note were generated with Scientist, clinical (histocompatibility and immunogenetics). Dictation errors may occur despite best attempts at proofreading.  Final Clinical Impression(s) / ED Diagnoses Final diagnoses:  None    Rx / DC Orders ED Discharge Orders    None       Cherly Anderson, PA-C 01/04/21 0631    Dione Booze, MD 01/04/21 [phone]

## 2021-01-04 NOTE — Discharge Instructions (Addendum)
You have been evaluated for your symptoms. You are dehydrated, please drink plenty of fluid. Your EKG shows a prolonged QT interval, which makes you susceptible for abnormal cardiac rhythm with certain types of medications.  Please follow up with a primary care provider, info below, for further outpatient management of your health.  Return if you have any concerns.

## 2021-02-24 ENCOUNTER — Ambulatory Visit: Payer: No Typology Code available for payment source | Admitting: Family Medicine

## 2021-03-23 ENCOUNTER — Telehealth (HOSPITAL_COMMUNITY): Payer: No Typology Code available for payment source | Admitting: Psychiatry

## 2021-03-23 ENCOUNTER — Telehealth (HOSPITAL_COMMUNITY): Payer: Self-pay | Admitting: Psychiatry

## 2021-03-23 ENCOUNTER — Other Ambulatory Visit: Payer: Self-pay

## 2021-03-23 NOTE — Telephone Encounter (Signed)
I called the patient at our scheduled appointment time. There was no answer. I was unable to leave a voice message because the mailbox was full.

## 2021-03-30 ENCOUNTER — Other Ambulatory Visit (HOSPITAL_COMMUNITY): Payer: Self-pay | Admitting: Psychiatry

## 2021-03-30 ENCOUNTER — Telehealth (INDEPENDENT_AMBULATORY_CARE_PROVIDER_SITE_OTHER): Payer: No Typology Code available for payment source | Admitting: Psychiatry

## 2021-03-30 ENCOUNTER — Other Ambulatory Visit: Payer: Self-pay

## 2021-03-30 DIAGNOSIS — F515 Nightmare disorder: Secondary | ICD-10-CM

## 2021-03-30 DIAGNOSIS — F900 Attention-deficit hyperactivity disorder, predominantly inattentive type: Secondary | ICD-10-CM | POA: Diagnosis not present

## 2021-03-30 DIAGNOSIS — F331 Major depressive disorder, recurrent, moderate: Secondary | ICD-10-CM | POA: Diagnosis not present

## 2021-03-30 DIAGNOSIS — F411 Generalized anxiety disorder: Secondary | ICD-10-CM | POA: Diagnosis not present

## 2021-03-30 MED ORDER — AMPHETAMINE-DEXTROAMPHETAMINE 20 MG PO TABS: 20.0000 mg | ORAL_TABLET | Freq: Every day | ORAL | 0 refills | Status: DC

## 2021-03-30 MED ORDER — ALPRAZOLAM 0.5 MG PO TABS: 0.5000 mg | ORAL_TABLET | Freq: Every evening | ORAL | 0 refills | Status: DC | PRN

## 2021-03-30 MED ORDER — DESVENLAFAXINE SUCCINATE ER 100 MG PO TB24
100.0000 mg | ORAL_TABLET | Freq: Every day | ORAL | 1 refills | Status: DC
Start: 2021-03-30 — End: 2021-07-03

## 2021-03-30 MED ORDER — ADDERALL XR 30 MG PO CP24: 30.0000 mg | ORAL_CAPSULE | Freq: Every day | ORAL | 0 refills | Status: DC

## 2021-03-30 MED ORDER — AMPHETAMINE-DEXTROAMPHETAMINE 20 MG PO TABS
20.0000 mg | ORAL_TABLET | Freq: Every day | ORAL | 0 refills | Status: DC
Start: 2021-03-30 — End: 2023-01-24

## 2021-03-30 MED ORDER — ADDERALL XR 30 MG PO CP24
30.0000 mg | ORAL_CAPSULE | Freq: Every day | ORAL | 0 refills | Status: DC
Start: 2021-03-30 — End: 2023-01-24

## 2021-03-30 NOTE — Progress Notes (Signed)
Virtual Visit via Telephone Note  I connected with Elwin Mocha on 03/30/21 at  1:15 PM EDT by telephone and verified that I am speaking with the correct person using two identifiers. Alany did not have access to video chatting while out  today.   Location: Patient: at her mother's house Provider: office   I discussed the limitations, risks, security and privacy concerns of performing an evaluation and management service by telephone and the availability of in person appointments. I also discussed with the patient that there may be a patient responsible charge related to this service. The patient expressed understanding and agreed to proceed.   History of Present Illness: Luis was evicted from her apartment suddenly. It was a long and trying ordeal. She was recently diagnosed with long Qt syndrome. She is now staying with her mom because her last 3 apartments with mold. Tauheedah is suffering from severe back pain and a dental work due to pain. Zulma was disappointed because she went to a Designer, industrial/product in [place] and states he didn't even look at her mouth.  She is always tired and is sleeping more than usual. She restarted Adderall to get up and moving. Her depression is due to all this stress. She actually feels more fatigued than depressed. Her anxiety is manageable and she still has Xanax left. She is anxious about her mom wanting Gentry to have a plan. It is stressful living with herWendy spends her days worrying and thinking about her health. Makeyla is very frustrated regarding her medical treatment. She denies SI/HI.    Observations/Objective:  General Appearance: unable to assess  Eye Contact:  unable to assess  Speech:  Clear and Coherent and Normal Rate  Volume:  Normal  Mood:  Anxious and Depressed  Affect:  Full Range  Thought Process:  Coherent and Descriptions of Associations: Circumstantial  Orientation:  Full (Time, Place, and Person)  Thought Content:  Rumination  Suicidal  Thoughts:  No  Homicidal Thoughts:  No  Memory:  Immediate;   Good  Judgement:  Good  Insight:  Good  Psychomotor Activity: unable to assess  Concentration:  Concentration: Good  Recall:  Good  Fund of Knowledge:  Good  Language:  Good  Akathisia:  unable to assess  Handed:  Right  AIMS (if indicated):     Assets:  Communication Skills Desire for Improvement Financial Resources/Insurance Housing Resilience Social Support Talents/Skills Transportation Vocational/Educational  ADL's:  unable to assess  Cognition:  WNL  Sleep:        Assessment and Plan: Depression screen [facility] 2/9 03/30/2021 12/29/2020  Decreased Interest 0 3  Down, Depressed, Hopeless 0 3  PHQ - 2 Score 0 6  Altered sleeping - 2  Tired, decreased energy - 3  Change in appetite - 2  Feeling bad or failure about yourself  - 3  Trouble concentrating - 0  Moving slowly or fidgety/restless - 0  Suicidal thoughts - 3  PHQ-9 Score - 19  Difficult doing work/chores - Extremely dIfficult    Flowsheet Row Video Visit from 03/30/2021 in [facility] ED from 01/04/2021 in [facility] EMERGENCY DEPARTMENT Video Visit from 12/29/2020 in [facility]  C-SSRS RISK CATEGORY No Risk No Risk Error: Q3, 4, or 5 should not be populated when Q2 is No      1. Attention deficit hyperactivity disorder (ADHD), predominantly inattentive type - ADDERALL XR 30 MG 24 hr capsule; Take 1 capsule (  30 mg total) by mouth daily.  Dispense: 30 capsule; Refill: 0 - amphetamine-dextroamphetamine (ADDERALL) 20 MG tablet; Take 1 tablet (20 mg total) by mouth daily in the afternoon.  Dispense: 30 tablet; Refill: 0 - ADDERALL XR 30 MG 24 hr capsule; Take 1 capsule (30 mg total) by mouth daily.  Dispense: 30 capsule; Refill: 0 - ADDERALL XR 30 MG 24 hr capsule; Take 1 capsule (30 mg total) by mouth daily.  Dispense: 30 capsule; Refill: 0 -  amphetamine-dextroamphetamine (ADDERALL) 20 MG tablet; Take 1 tablet (20 mg total) by mouth daily.  Dispense: 30 tablet; Refill: 0 - amphetamine-dextroamphetamine (ADDERALL) 20 MG tablet; Take 1 tablet (20 mg total) by mouth daily.  Dispense: 30 tablet; Refill: 0  2. GAD (generalized anxiety disorder) - desvenlafaxine (PRISTIQ) 100 MG 24 hr tablet; Take 1 tablet (100 mg total) by mouth daily.  Dispense: 30 tablet; Refill: 1 - ALPRAZolam (XANAX) 0.5 MG tablet; Take 1 tablet (0.5 mg total) by mouth at bedtime as needed for anxiety.  Dispense: 30 tablet; Refill: 0  3. MDD (major depressive disorder), recurrent episode, moderate (HCC) - desvenlafaxine (PRISTIQ) 100 MG 24 hr tablet; Take 1 tablet (100 mg total) by mouth daily.  Dispense: 30 tablet; Refill: 1  4. Nightmares D/c Prazosin due to pt preference  Follow Up Instructions: In 2-3 months or sooner if needed   I discussed the assessment and treatment plan with the patient. The patient was provided an opportunity to ask questions and all were answered. The patient agreed with the plan and demonstrated an understanding of the instructions.   The patient was advised to call back or seek an in-person evaluation if the symptoms worsen or if the condition fails to improve as anticipated.  I provided 22 minutes of non-face-to-face time during this encounter.   Oletta Darter, MD

## 2021-06-22 ENCOUNTER — Telehealth (HOSPITAL_COMMUNITY): Payer: No Typology Code available for payment source | Admitting: Psychiatry

## 2021-06-29 ENCOUNTER — Telehealth (HOSPITAL_COMMUNITY): Payer: No Typology Code available for payment source | Admitting: Psychiatry

## 2021-06-29 ENCOUNTER — Telehealth (HOSPITAL_COMMUNITY): Payer: Self-pay | Admitting: *Deleted

## 2021-06-29 NOTE — Telephone Encounter (Signed)
Received refill request from pt pharmacy for Desvenlafaxine ER Succinate 100 mg. Last written on 03/30/21  with one fill. Pt has an upcoming appointment on 08/10/21. Last two appointments cx. Please review.

## 2021-07-03 ENCOUNTER — Other Ambulatory Visit (HOSPITAL_COMMUNITY): Payer: Self-pay | Admitting: *Deleted

## 2021-07-03 DIAGNOSIS — F411 Generalized anxiety disorder: Secondary | ICD-10-CM

## 2021-07-03 DIAGNOSIS — F331 Major depressive disorder, recurrent, moderate: Secondary | ICD-10-CM

## 2021-07-03 MED ORDER — DESVENLAFAXINE SUCCINATE ER 100 MG PO TB24: 100.0000 mg | ORAL_TABLET | Freq: Every day | ORAL | 1 refills | Status: DC

## 2021-07-19 ENCOUNTER — Other Ambulatory Visit: Payer: Self-pay | Admitting: Dentistry

## 2021-07-19 ENCOUNTER — Other Ambulatory Visit (HOSPITAL_COMMUNITY): Payer: Self-pay | Admitting: Dentistry

## 2021-07-19 DIAGNOSIS — B89 Unspecified parasitic disease: Secondary | ICD-10-CM

## 2021-07-26 ENCOUNTER — Ambulatory Visit (HOSPITAL_COMMUNITY)
Admission: RE | Admit: 2021-07-26 | Discharge: 2021-07-26 | Disposition: A | Discharge status: Home / Self Care | Payer: No Typology Code available for payment source | Source: Ambulatory Visit | Attending: Dentistry | Admitting: Dentistry

## 2021-07-26 ENCOUNTER — Other Ambulatory Visit: Payer: Self-pay

## 2021-07-26 DIAGNOSIS — B89 Unspecified parasitic disease: Secondary | ICD-10-CM | POA: Insufficient documentation

## 2021-07-26 MED ORDER — IOHEXOL 350 MG/ML SOLN
75.0000 mL | Freq: Once | INTRAVENOUS | Status: AC | PRN
  Administered 2021-07-26: 75 mL via INTRAVENOUS

## 2021-08-10 ENCOUNTER — Other Ambulatory Visit: Payer: Self-pay

## 2021-08-10 ENCOUNTER — Telehealth (HOSPITAL_COMMUNITY): Payer: Self-pay | Admitting: Psychiatry

## 2021-08-10 ENCOUNTER — Telehealth (HOSPITAL_COMMUNITY): Payer: No Typology Code available for payment source | Admitting: Psychiatry

## 2021-08-10 NOTE — Telephone Encounter (Signed)
I called the patient at our scheduled appointment time. There was no answer. I left a voice message for patient to call the clinic back at their convinence.   

## 2021-09-14 ENCOUNTER — Telehealth (HOSPITAL_COMMUNITY): Payer: Self-pay | Admitting: *Deleted

## 2021-09-14 ENCOUNTER — Telehealth (HOSPITAL_BASED_OUTPATIENT_CLINIC_OR_DEPARTMENT_OTHER): Payer: No Typology Code available for payment source | Admitting: Psychiatry

## 2021-09-14 ENCOUNTER — Other Ambulatory Visit: Payer: Self-pay

## 2021-09-14 DIAGNOSIS — F331 Major depressive disorder, recurrent, moderate: Secondary | ICD-10-CM | POA: Diagnosis not present

## 2021-09-14 DIAGNOSIS — F411 Generalized anxiety disorder: Secondary | ICD-10-CM | POA: Diagnosis not present

## 2021-09-14 MED ORDER — DESVENLAFAXINE SUCCINATE ER 100 MG PO TB24: 100.0000 mg | ORAL_TABLET | Freq: Every day | ORAL | 0 refills | Status: DC

## 2021-09-14 MED ORDER — ALPRAZOLAM 0.5 MG PO TABS: 0.5000 mg | ORAL_TABLET | Freq: Every evening | ORAL | 0 refills | Status: DC | PRN

## 2021-09-14 NOTE — Progress Notes (Signed)
Virtual Visit via Telephone Note  I connected with Teresa Hensley on 09/14/21 at  4:45 PM EST by telephone and verified that I am speaking with the correct person using two identifiers.  Location: Patient: in hotel room Provider: office   I discussed the limitations, risks, security and privacy concerns of performing an evaluation and management service by telephone and the availability of in person appointments. I also discussed with the patient that there may be a patient responsible charge related to this service. The patient expressed understanding and agreed to proceed.   History of Present Illness: Teresa Hensley shares that she is in a hotel room and doesn't have good reception. The phone cuts in and out. She was diagnosed with several fungal infections. Teresa Hensley is in [place] and is in a program for Development worker, international aid. She is now working on trying to get treatment for the infection in her mouth. She was told she needs to see a doctor in [place] and will have to have plastic surgery sometime next year. Teresa Hensley shares that is very scary and she has had 3 major panic attacks. She treated it with some of her previous Xanax.  Teresa Hensley had no family support for a while. She is very frustrated by everything and is in a lot of pain. Teresa Hensley is experiencing a lot of muscle pain as well. This is something she has been dealing with for the last 10 years. It is overwhelming but she now has the support of her best friend and mom again. Mostly she has been managing her anxiety but really wants to have some Xanax in case the panic attacks get worse. She denies SI/HI. Teresa Hensley did not take Adderall while with her mom. She recently restarted Adderall XR and it helps to improve her focus and energy.    Observations/Objective:  General Appearance: unable to assess  Eye Contact:  unable to assess  Speech:  Clear and Coherent and Normal Rate  Volume:  Normal  Mood:  Anxious  Affect:  Congruent  Thought Process:  Coherent  and Descriptions of Associations: Circumstantial  Orientation:  Full (Time, Place, and Person)  Thought Content:  Rumination  Suicidal Thoughts:  No  Homicidal Thoughts:  No  Memory:  Immediate;   Good  Judgement:  Good  Insight:  Good  Psychomotor Activity: unable to assess  Concentration:  Concentration: Good  Recall:  Good  Fund of Knowledge:  Good  Language:  Good  Akathisia:  unable to assess  Handed:  unable to assess  AIMS (if indicated):     Assets:  Communication Skills Desire for Improvement Financial Resources/Insurance Housing Resilience Social Support Talents/Skills Transportation Vocational/Educational  ADL's:  unable to assess  Cognition:  WNL  Sleep:        Assessment and Plan: - no refill on Adderall XR  - d/c Adderall 20mg  qD  1. GAD (generalized anxiety disorder) - ALPRAZolam (XANAX) 0.5 MG tablet; Take 1 tablet (0.5 mg total) by mouth at bedtime as needed for anxiety.  Dispense: 30 tablet; Refill: 0 - desvenlafaxine (PRISTIQ) 100 MG 24 hr tablet; Take 1 tablet (100 mg total) by mouth daily.  Dispense: 30 tablet; Refill: 0  2. MDD (major depressive disorder), recurrent episode, moderate (HCC) - desvenlafaxine (PRISTIQ) 100 MG 24 hr tablet; Take 1 tablet (100 mg total) by mouth daily.  Dispense: 30 tablet; Refill: 0   Follow Up Instructions: In 3-4 weeks or sooner if needed   I discussed the assessment and treatment plan with the  patient. The patient was provided an opportunity to ask questions and all were answered. The patient agreed with the plan and demonstrated an understanding of the instructions.   The patient was advised to call back or seek an in-person evaluation if the symptoms worsen or if the condition fails to improve as anticipated.  I provided 18 minutes of non-face-to-face time during this encounter.   Oletta Darter, MD

## 2021-09-14 NOTE — Telephone Encounter (Signed)
Pt called today very tearful and anxious. Teresa Hensley says she missed her last appointment with you on 08/10/21 due to the fact that she was in [place] and went to the hospital or a biologic dentist ater she pulled "a worm from my lip". Pt states that she now has another one on the opposite side of face, jaw to lip. Pt states she going to have to go out of state to have plastic surgery on her face and see a parasitologist. This has lead to great increase in anxiety and she is almost out of Xanax 0.5 mg ans is requesting refill. I've sent message to front desk to schedule pt appointment. Please review.

## 2021-09-15 ENCOUNTER — Other Ambulatory Visit (HOSPITAL_COMMUNITY): Payer: Self-pay | Admitting: *Deleted

## 2021-09-15 DIAGNOSIS — F411 Generalized anxiety disorder: Secondary | ICD-10-CM

## 2021-09-15 MED ORDER — ALPRAZOLAM 0.5 MG PO TABS: 0.5000 mg | ORAL_TABLET | Freq: Every evening | ORAL | 0 refills | Status: DC | PRN

## 2021-09-28 ENCOUNTER — Telehealth (HOSPITAL_COMMUNITY): Payer: No Typology Code available for payment source | Admitting: Psychiatry

## 2021-10-05 ENCOUNTER — Telehealth (HOSPITAL_COMMUNITY): Payer: No Typology Code available for payment source | Admitting: Psychiatry

## 2021-11-23 ENCOUNTER — Telehealth (HOSPITAL_COMMUNITY): Payer: No Typology Code available for payment source | Admitting: Psychiatry

## 2022-01-11 ENCOUNTER — Telehealth (HOSPITAL_COMMUNITY): Payer: No Typology Code available for payment source | Admitting: Psychiatry

## 2022-07-10 IMAGING — CT CT MAXILLOFACIAL W/ CM
3 series · 15 of 47 positions shown, 18 images · IV contrast (omnipaque)
Comparison: None.

CLINICAL DATA: Parasitic infection

EXAM:
CT MAXILLOFACIAL WITH CONTRAST
TECHNIQUE: Multidetector CT imaging of the maxillofacial structures was
performed with intravenous contrast. Multiplanar CT image
reconstructions were also generated.
CONTRAST:  75mL OMNIPAQUE IOHEXOL 350 MG/ML SOLN

[Series 3: max soft · axial · 0.34mm/px · z∈[-201,-73]mm · 9 of 76 slices shown, 12 images]
[im 6/76  brain]
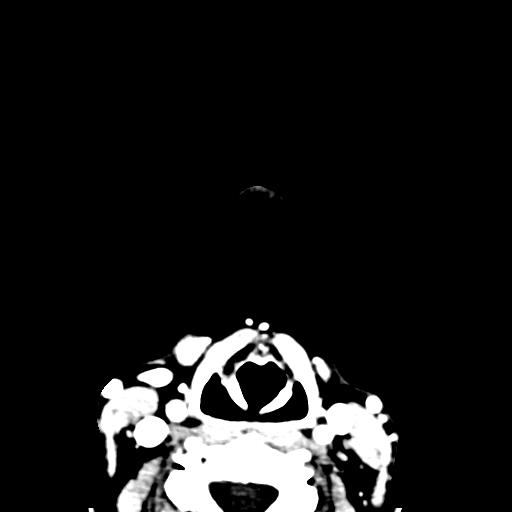
[im 6/76  bone]
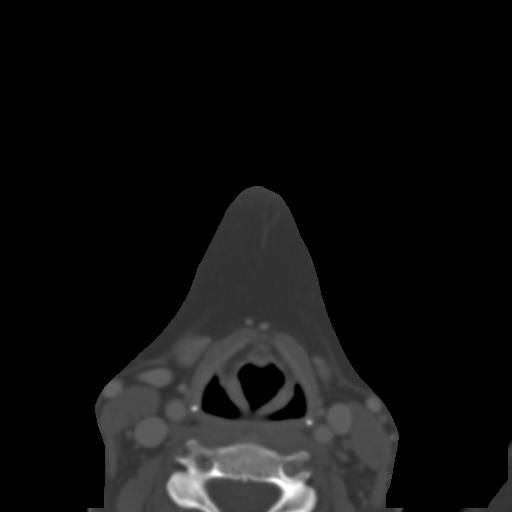
[im 13/76  bone]
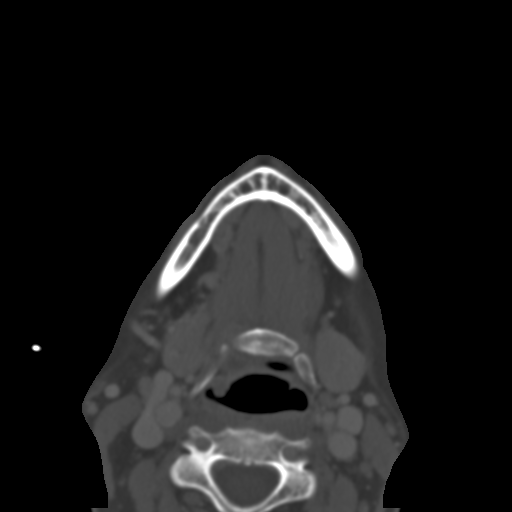
[im 21/76  bone]
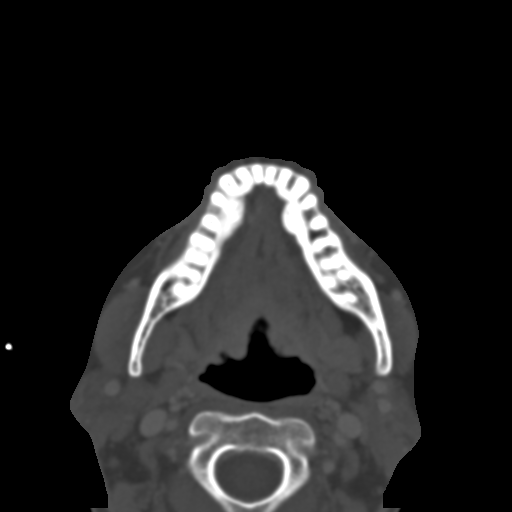
[im 29/76  bone]
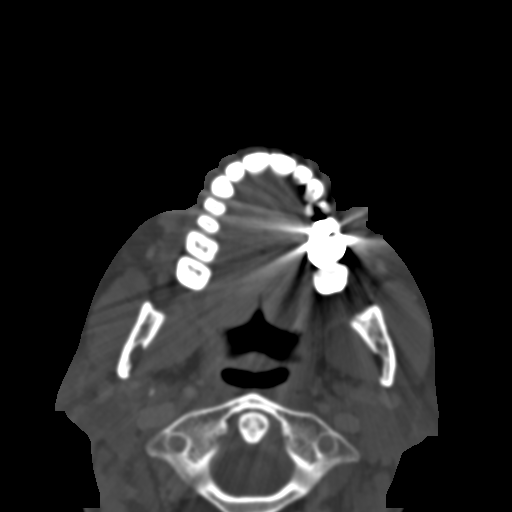
[im 39/76  brain]
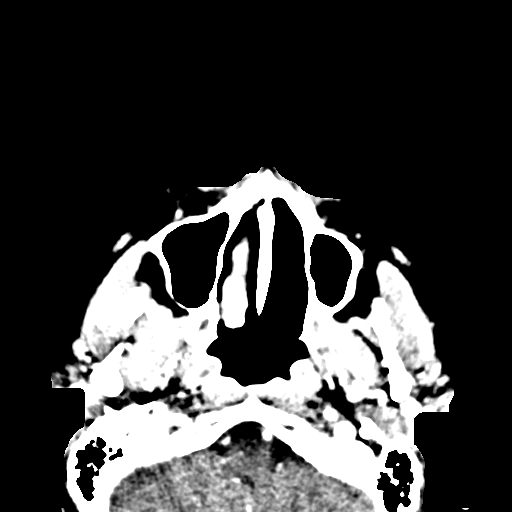
[im 39/76  bone]
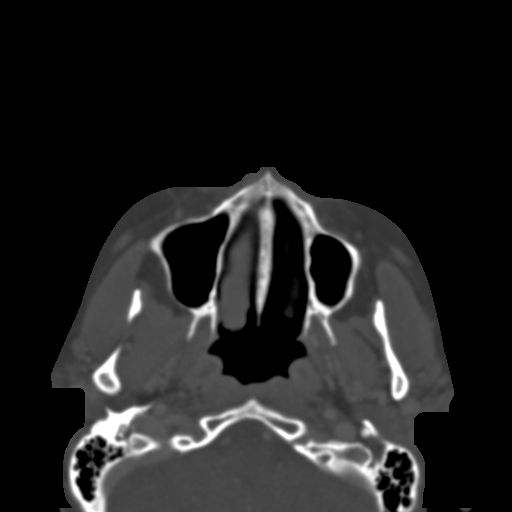
[im 47/76  bone]
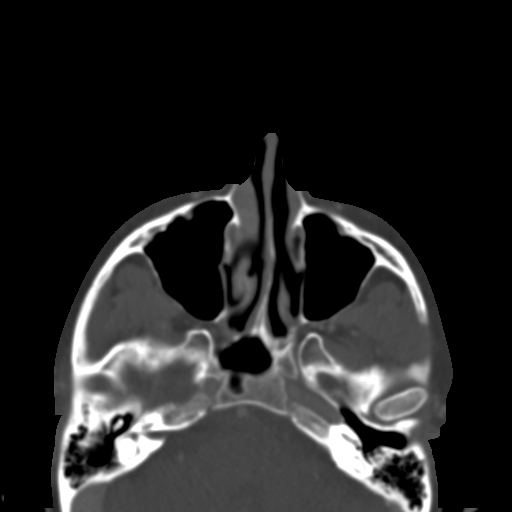
[im 55/76  bone]
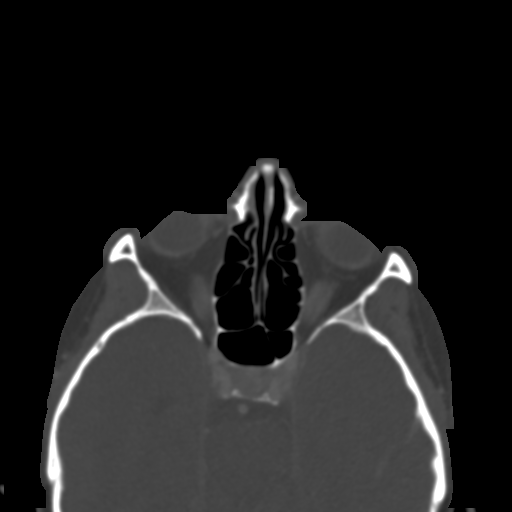
[im 63/76  bone]
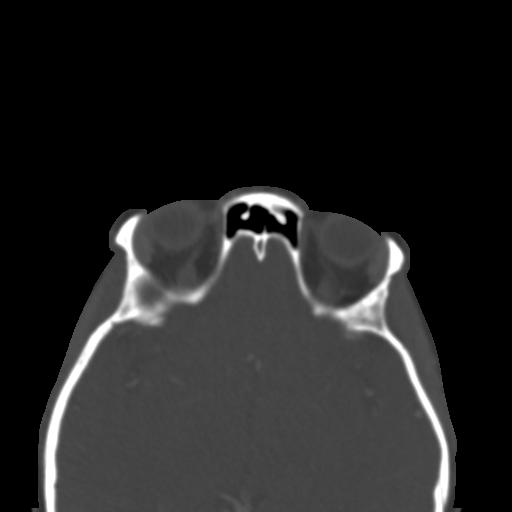
[im 70/76  brain]
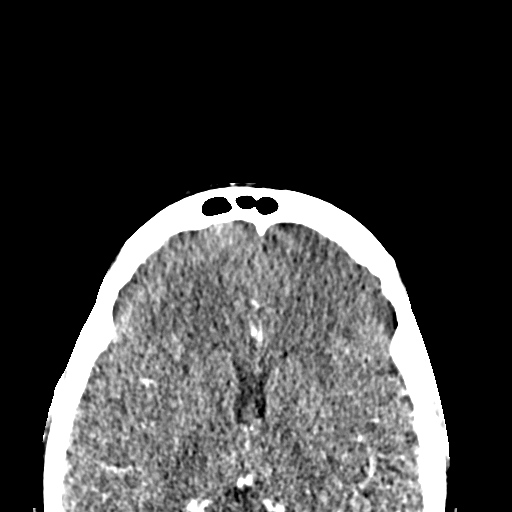
[im 70/76  bone]
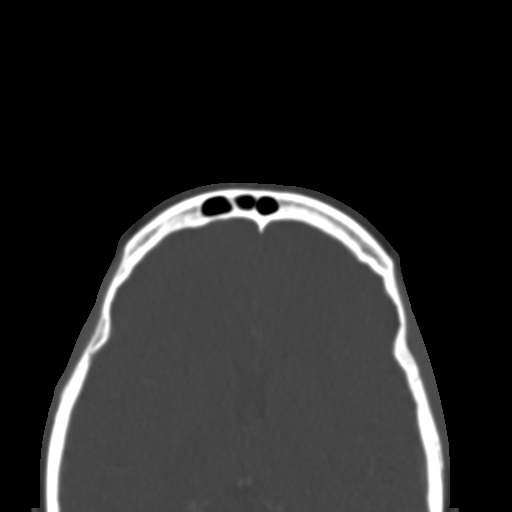

[Series 7: coronal soft · coronal · 0.34mm/px · 3 of 65 slices shown]
[im 22/65  bone]
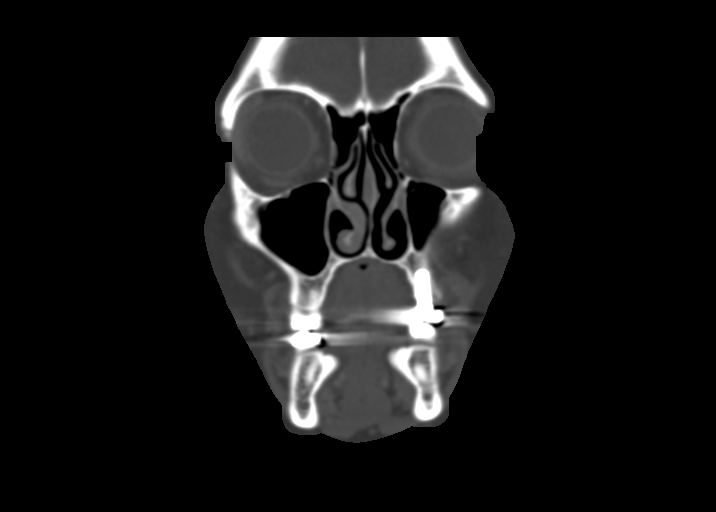
[im 29/65  bone]
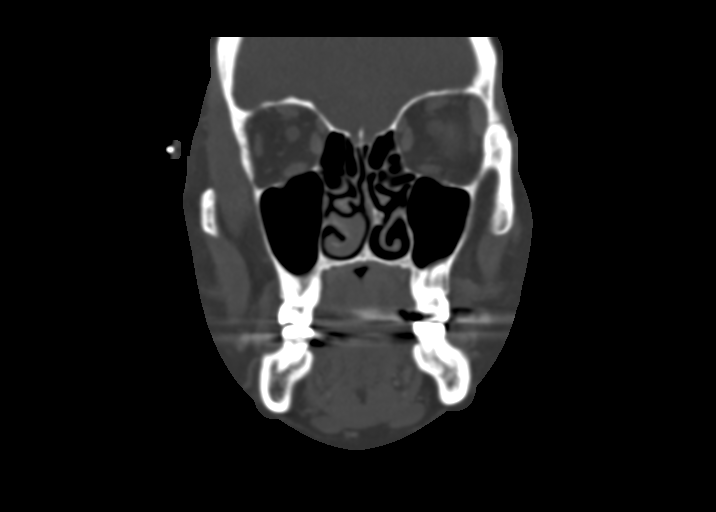
[im 36/65  bone]
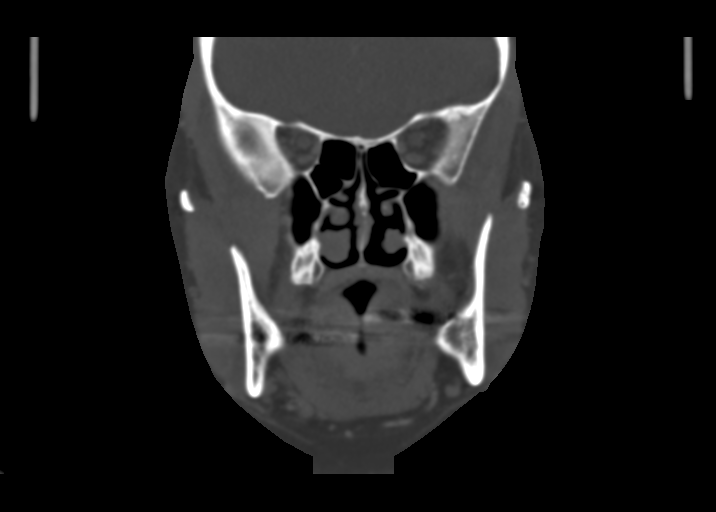

[Series 8: sagittal soft · sagittal · 0.29mm/px · 3 of 81 slices shown]
[im 27/81  bone]
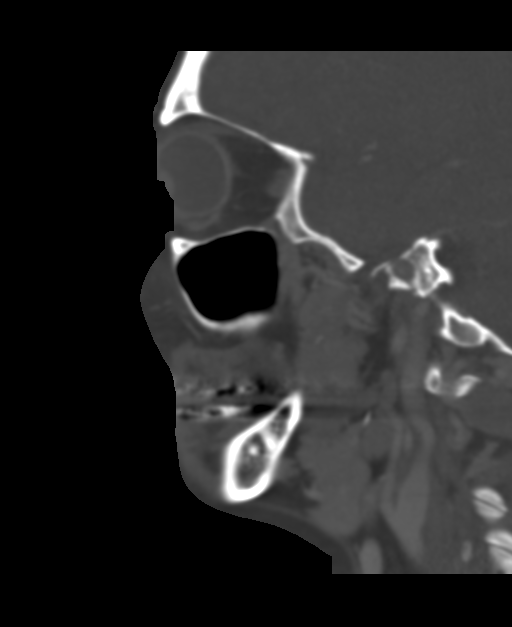
[im 41/81  bone]
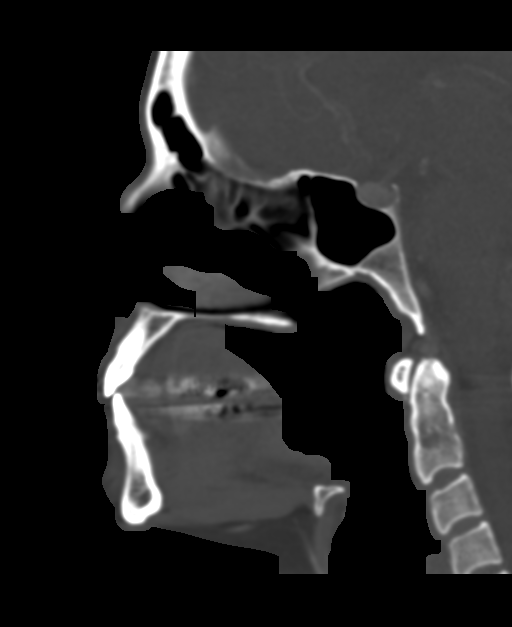
[im 54/81  bone]
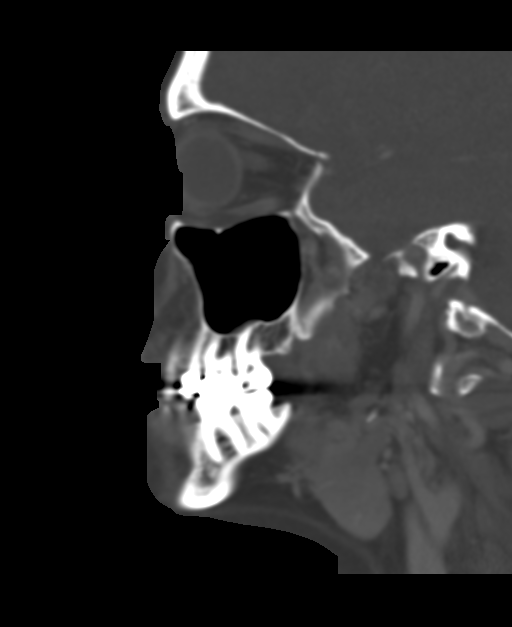

[15 of 47 positions shown; findings below may reference images not displayed]

FINDINGS: Osseous: No fracture or mandibular dislocation. No destructive
process.

Orbits: Negative. No traumatic or inflammatory finding.

Sinuses: Clear.

Soft tissues: Radiopaque marker overlying the right temple (series
3, image 57); no abnormality is seen inferior to the radiopaque
marker. The soft tissues are unremarkable.

Limited intracranial: No significant or unexpected finding.
IMPRESSION: No acute finding in the face. Normal appearance of the facial soft
tissues.

## 2023-01-24 ENCOUNTER — Telehealth (HOSPITAL_COMMUNITY): Payer: Self-pay | Admitting: Psychiatry

## 2023-01-24 ENCOUNTER — Telehealth (HOSPITAL_COMMUNITY): Payer: No Typology Code available for payment source | Admitting: Psychiatry

## 2023-01-24 DIAGNOSIS — F411 Generalized anxiety disorder: Secondary | ICD-10-CM

## 2023-01-24 MED ORDER — CLONIDINE HCL 0.1 MG PO TABS
0.1000 mg | ORAL_TABLET | Freq: Two times a day (BID) | ORAL | 0 refills | Status: DC | PRN
Start: 2023-01-24 — End: 2023-01-24

## 2023-01-24 NOTE — Progress Notes (Signed)
Virtual Visit via Video Note  I connected with Teresa Hensley on 01/24/23 at  1:15 PM EDT by a video enabled telemedicine application and verified that I am speaking with the correct person using two identifiers.  Location: Patient: at her home Provider: office   I discussed the limitations of evaluation and management by telemedicine and the availability of in person appointments. The patient expressed understanding and agreed to proceed.  History of Present Illness: I last saw Teresa Hensley in December 2022. She was out of the country for 8 months. She went to [place] and sought treatment there. She was not able to get treatment there because she didn't have a medical visa. She then flew to [place] and had surgery on both her cheeks. She is still having black excretions coming out of her skin all over her body. She was diagnosed with a subcutaneous infection from a parasite. 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 She is now on antiparasitic medications but it doesn't seem to be work. No other treatment options are available per her doctors. She has heard that a medication that is only available thru the CDC could help. Teresa Hensley tried to get a referral to the [facility] but was unsuccessful. She tried to contact the CDC herself but could not figure out how to do so. She is frustrated and anxious. Teresa Hensley is scared for her life because she doesn't know what to do now. She is very anxious and cries often. At times she feels hopeless and helpless. She wakes up with panic attacks in the morning. The anxiety is worse in the morning and as the day progresses it continues. She is able to fall asleep but wakes up around 3am most nights. She has racing thoughts and is exhausted. Her focus is poor and she is unmotivated. The depression is directly related to her health problems. She denies SI/HI.    Observations/Objective: Psychiatric Specialty Exam: ROS  There were no vitals taken for this visit.There is no height  or weight on file to calculate BMI.  General Appearance: Casual and Fairly Groomed  Eye Contact:  Good  Speech:  Clear and Coherent and Normal Rate  Volume:  Normal  Mood:  Anxious and Depressed  Affect:  Congruent  Thought Process:  Coherent and Descriptions of Associations: Circumstantial  Orientation:  Full (Time, Place, and Person)  Thought Content:  Rumination  Suicidal Thoughts:  No  Homicidal Thoughts:  No  Memory:  Immediate;   Good  Judgement:  Good  Insight:  Good  Psychomotor Activity:  Normal  Concentration:  Concentration: Good  Recall:  Good  Fund of Knowledge:  Good  Language:  Good  Akathisia:  No  Handed:  Right  AIMS (if indicated):     Assets:  Communication Skills Desire for Improvement Financial Resources/Insurance Housing Social Support Talents/Skills Transportation Vocational/Educational  ADL's:  Intact  Cognition:  WNL  Sleep:        Assessment and Plan:     03/30/2021    1:39 PM 12/29/2020    3:37 PM  Depression screen PHQ 2/9  Decreased Interest 0 3  Down, Depressed, Hopeless 0 3  PHQ - 2 Score 0 6  Altered sleeping  2  Tired, decreased energy  3  Change in appetite  2  Feeling bad or failure about yourself   3  Trouble concentrating  0  Moving slowly or fidgety/restless  0  Suicidal thoughts  3  PHQ-9 Score  19  Difficult doing  work/chores  Extremely dIfficult    Flowsheet Row Video Visit from 03/30/2021 in [facility] from 01/04/2021 in [facility] Video [facility]  C-SSRS RISK CATEGORY No Risk No Risk Error: Q3, 4, or 5 should not be populated when Q2 is No          Pt is aware that these meds carry a teratogenic risk. Pt will discuss plan of action if she does or plans to become pregnant in the future.  Status of current problems: worsening anxiety   Medication management  with supportive therapy. Risks and benefits, side effects and alternative treatment options discussed with patient. Pt was given an opportunity to ask questions about medication, illness, and treatment. All current psychiatric medications have been reviewed and discussed with the patient and adjusted as clinically appropriate.  Pt verbalized understanding and verbal consent obtained for treatment.  Meds: Teresa Hensley has tried numerous antidepressants but states nothing has been tolerated due to SE. She has been off all psychiatric medications for many months now.  D/c Pristiq- was causing electric shocks thru out her body  I declined her request to refill Adderall due to concerns that it could make her anxiety worse. She was agreeable to that.  She claims that only Xanax helps to calm her anxiety and allow for some rest at night. I am not comfortable prescribing a controlled substance for her anxiety without any other treatment. I explained that benzodiazepines are only used PRN and do not address the underlying cause of the anxiety. For this reason I am not comfortable simply prescribing Xanax without some sort of medication to address the chemical imbalance.  She declined a therapy referral. Teresa Hensley is upset and states that it feels cruel to let her suffer and leaves no other option than to go to the ED for her panic attacks, as she has done several times in the last few weeks. We talked about using Clonidine but she declined. She then stated she wants a new doctor.     1. GAD (generalized anxiety disorder)     Labs: none    Therapy: brief supportive therapy provided. Discussed psychosocial stressors in detail.      Collaboration of Care: declined therapy referral.   Patient/Guardian was advised Release of Information must be obtained prior to any record release in order to collaborate their care with an outside provider. Patient/Guardian was advised if they have not already done so to contact the  registration department to sign all necessary forms in order for [place] to release information regarding their care.   Consent: Patient/Guardian gives verbal consent for treatment and assignment of benefits for services provided during this visit. Patient/Guardian expressed understanding and agreed to proceed.     Follow Up Instructions: Declined follow up with me, pt may schedule follow up with me if needed. I asked if she would like to follow up with someone within [facility] behavioral Outpatient clinic or be referred out and she stated she didn't know. She asked to end today's  visit after that.     I discussed the assessment and treatment plan with the patient. The patient was provided an opportunity to ask questions and all were answered. The patient agreed with the plan and demonstrated an understanding of the instructions.   The patient was advised to call back or seek an in-person evaluation if the symptoms worsen or if the condition fails to  improve as anticipated.  I provided 32 minutes of non-face-to-face time during this encounter.   Teresa Darter, MD

## 2023-01-24 NOTE — Telephone Encounter (Signed)
Teresa Hensley was able to log on to [facility] for our scheduled visit. Unfortunetly she was not able to unmute her microphone so we could not talk. I advised her to call IT for help with mychart and asked her to reschedule her visit once that has been resolved. She was agreeable to the plan.

## 2023-02-11 ENCOUNTER — Encounter (HOSPITAL_COMMUNITY): Payer: Self-pay

## 2023-04-04 ENCOUNTER — Encounter: Payer: Self-pay | Admitting: Internal Medicine

## 2023-04-04 ENCOUNTER — Ambulatory Visit: Payer: No Typology Code available for payment source | Admitting: Internal Medicine

## 2023-04-04 VITALS — BP 122/70 | HR 98 | Temp 98.7°F | Ht 66.0 in | Wt 137.6 lb

## 2023-04-04 DIAGNOSIS — F99 Mental disorder, not otherwise specified: Secondary | ICD-10-CM | POA: Diagnosis not present

## 2023-04-04 NOTE — Progress Notes (Signed)
[facility] [address] [place] [address] Dept: [phone] Dept Fax: [phone]  New Patient Office Visit  Subjective:   Teresa Hensley 10/23/1975 04/04/2023  Chief Complaint  Patient presents with   Establish Care   Medication Refill    HPI: Teresa Hensley presents today to establish care at [facility]. Introduced to Publishing rights manager role and practice setting.  All questions answered.   Last annual physical: at least 1 year ago  Concerns: See below   Patient reports to provider that she has seen multiple specialists throughout the [place] (dermatology, infectious disease, internal medicine, oral surgery, veterinary medicine (has pets at home and was considered a potential source of symptoms) ) and overseas ([place], [place], [place], [place]) seeking out treatment for possible parasitic infection, possible black bugs or fleas coming out of her skin, and a black mucosa coming from her cheek.  Patient has brought paper plate in a sealed plastic bag with her to show the possible fleas that come off of her skin.  She also shows a picture of a microscopic picture of the possible insect on the plate.  She also reports that she has lived in 3 homes with mold, was unsure if this was the cause of her symptoms. She states that she has been requesting to be evaluated by the CDC.  She was requesting to see if this provider can help her get in contact with the CDC.  She also presents for refills of her Adderall.  She is currently under the management of behavioral health, however her provider is leaving that current practice.   The following portions of the patient's history were reviewed and updated as appropriate: past medical history, past surgical history, family history, social history, allergies, medications, and problem list.   Patient Active Problem List   Diagnosis Date Noted    Starvation ketoacidosis 09/10/2018   Psychiatric illness 09/10/2018   ADHD (attention deficit hyperactivity disorder) 12/26/2011   Anxiety disorder 12/26/2011   Past Medical History:  Diagnosis Date   ADHD (attention deficit hyperactivity disorder)    Anxiety    Depression    Seizures (HCC)    as child   Tapeworm infection    Tapeworm infection    Past Surgical History:  Procedure Laterality Date   pelvic mass removal     TONSILLECTOMY     History reviewed. No pertinent family history. Outpatient Medications Prior to Visit  Medication Sig Dispense Refill   ALPRAZolam (XANAX) 0.5 MG tablet Take by mouth as needed.     amphetamine-dextroamphetamine (ADDERALL) 30 MG tablet Take 10 mg by mouth daily.     ibuprofen (ADVIL) 200 MG tablet Take 600 mg by mouth every 6 (six) hours as needed for mild pain.     Doxycycline Hyclate 50 MG TABS Take 50 mg by mouth daily. (Patient not taking: Reported on 03/30/2021)     liothyronine (CYTOMEL) 5 MCG tablet Take 5 mcg by mouth daily. (Patient not taking: Reported on 01/24/2023)     pantoprazole (PROTONIX) 20 MG tablet Take 1 tablet (20 mg total) by mouth daily. (Patient not taking: Reported on 01/24/2023) 30 tablet 0   praziquantel (BILTRICIDE) 600 MG tablet Take 600 mg by mouth once a week.     No facility-administered medications prior to visit.   No Active Allergies   ROS: A complete ROS was performed with pertinent positives/negatives noted in the HPI. The remainder of the ROS  are negative.   Objective:   Today's Vitals   04/04/23 1331  BP: 122/70  Pulse: 98  Temp: 98.7 F (37.1 C)  TempSrc: Temporal  SpO2: 100%  Weight: 137 lb 9.6 oz (62.4 kg)  Height: 5\' 6"  (1.676 m)    GENERAL: Well-appearing, in NAD. Well nourished.  SKIN: Pink, warm and dry. No rash, lesion, ulceration, or ecchymoses.  HEENT:    HEAD: Normocephalic, non-traumatic.  EYES: Conjunctive pink without exudate. THROAT: Uvula midline. Oropharynx clear. T Mucus  membranes pink and moist.  NECK: Trachea midline. Full ROM w/o pain or tenderness. No lymphadenopathy.  RESPIRATORY: Chest wall symmetrical. Respirations even and non-labored. Breath sounds clear to auscultation bilaterally.  CARDIAC: S1, S2 present, regular rate and rhythm. Peripheral pulses 2+ bilaterally.  MSK: Muscle tone and strength appropriate for age.  EXTREMITIES: Without clubbing, cyanosis, or edema.  NEUROLOGIC: Steady, even gait.  PSYCH/MENTAL STATUS: Alert, oriented x 3. Cooperative, intermittently tearful.   There are no preventive care reminders to display for this patient.  No results found for any visits on 04/04/23.     Assessment & Plan:  Psychiatric illness Assessment & Plan: Patient was instructed that she will need to follow-up with behavioral health for medication management of her anxiety and ADHD medications due to the complexity of her illness.  I did not find any abnormal findings on patient's physical exam, her oral mucosa and skin were unremarkable.  I recommend patient continue with psychiatric care as there have been no findings related to her physical ailments or complaints.      Return in about 6 months (around 10/04/2023) for Fasting Annual Physical Exam.   [name] Decent, FNP

## 2023-04-04 NOTE — Assessment & Plan Note (Signed)
Patient was instructed that she will need to follow-up with behavioral health for medication management of her anxiety and ADHD medications due to the complexity of her illness.  I did not find any abnormal findings on patient's physical exam, her oral mucosa and skin were unremarkable.  I recommend patient continue with psychiatric care as there have been no findings related to her physical ailments or complaints.

## 2023-05-07 ENCOUNTER — Ambulatory Visit (HOSPITAL_COMMUNITY): Payer: No Typology Code available for payment source | Admitting: Psychiatry

## 2024-02-17 ENCOUNTER — Other Ambulatory Visit: Payer: Self-pay

## 2024-02-17 ENCOUNTER — Encounter (HOSPITAL_COMMUNITY): Payer: Self-pay | Admitting: Radiology

## 2024-02-17 ENCOUNTER — Inpatient Hospital Stay (HOSPITAL_COMMUNITY)
Admission: EM | Admit: 2024-02-17 | Discharge: 2024-02-19 | DRG: 683 | Disposition: A | Discharge status: Home / Self Care | Attending: Family Medicine | Admitting: Family Medicine

## 2024-02-17 ENCOUNTER — Emergency Department (HOSPITAL_COMMUNITY)

## 2024-02-17 DIAGNOSIS — E860 Dehydration: Secondary | ICD-10-CM | POA: Diagnosis present

## 2024-02-17 DIAGNOSIS — R000 Tachycardia, unspecified: Secondary | ICD-10-CM | POA: Diagnosis present

## 2024-02-17 DIAGNOSIS — E871 Hypo-osmolality and hyponatremia: Secondary | ICD-10-CM | POA: Diagnosis present

## 2024-02-17 DIAGNOSIS — N179 Acute kidney failure, unspecified: Secondary | ICD-10-CM | POA: Diagnosis present

## 2024-02-17 DIAGNOSIS — K760 Fatty (change of) liver, not elsewhere classified: Secondary | ICD-10-CM | POA: Diagnosis present

## 2024-02-17 DIAGNOSIS — F32A Depression, unspecified: Secondary | ICD-10-CM | POA: Diagnosis present

## 2024-02-17 DIAGNOSIS — R112 Nausea with vomiting, unspecified: Secondary | ICD-10-CM | POA: Diagnosis present

## 2024-02-17 DIAGNOSIS — F99 Mental disorder, not otherwise specified: Secondary | ICD-10-CM

## 2024-02-17 DIAGNOSIS — F909 Attention-deficit hyperactivity disorder, unspecified type: Secondary | ICD-10-CM | POA: Diagnosis present

## 2024-02-17 DIAGNOSIS — E876 Hypokalemia: Secondary | ICD-10-CM | POA: Diagnosis not present

## 2024-02-17 DIAGNOSIS — D649 Anemia, unspecified: Secondary | ICD-10-CM | POA: Diagnosis present

## 2024-02-17 DIAGNOSIS — I10 Essential (primary) hypertension: Secondary | ICD-10-CM | POA: Diagnosis present

## 2024-02-17 DIAGNOSIS — E8721 Acute metabolic acidosis: Secondary | ICD-10-CM | POA: Diagnosis present

## 2024-02-17 DIAGNOSIS — F419 Anxiety disorder, unspecified: Secondary | ICD-10-CM | POA: Diagnosis present

## 2024-02-17 LAB — COMPREHENSIVE METABOLIC PANEL WITH GFR
ALT: 37 U/L (ref 0–44)
AST: 32 U/L (ref 15–41)
Albumin: 5.8 g/dL — ABNORMAL HIGH (ref 3.5–5.0)
Alkaline Phosphatase: 84 U/L (ref 38–126)
Anion gap: 19 — ABNORMAL HIGH (ref 5–15)
BUN: 76 mg/dL — ABNORMAL HIGH (ref 6–20)
CO2: 19 mmol/L — ABNORMAL LOW (ref 22–32)
Calcium: 9.9 mg/dL (ref 8.9–10.3)
Chloride: 91 mmol/L — ABNORMAL LOW (ref 98–111)
Creatinine, Ser: 1.9 mg/dL — ABNORMAL HIGH (ref 0.44–1.00)
GFR, Estimated: 33 mL/min — ABNORMAL LOW (ref >60)
Glucose, Bld: 185 mg/dL — ABNORMAL HIGH (ref 70–99)
Potassium: 3.6 mmol/L (ref 3.5–5.1)
Sodium: 129 mmol/L — ABNORMAL LOW (ref 135–145)
Total Bilirubin: 2.5 mg/dL — ABNORMAL HIGH (ref 0.0–1.2)
Total Protein: 9.3 g/dL — ABNORMAL HIGH (ref 6.5–8.1)

## 2024-02-17 LAB — URINALYSIS, ROUTINE W REFLEX MICROSCOPIC
Bacteria, UA: NONE SEEN
Bilirubin Urine: NEGATIVE
Glucose, UA: NEGATIVE mg/dL
Hgb urine dipstick: NEGATIVE
Ketones, ur: 5 mg/dL — AB
Leukocytes,Ua: NEGATIVE
Nitrite: NEGATIVE
Protein, ur: 100 mg/dL — AB
Specific Gravity, Urine: 1.023 (ref 1.005–1.030)
pH: 5 (ref 5.0–8.0)

## 2024-02-17 LAB — CBC
HCT: 44.7 % (ref 36.0–46.0)
Hemoglobin: 15.8 g/dL — ABNORMAL HIGH (ref 12.0–15.0)
MCH: 32.1 pg (ref 26.0–34.0)
MCHC: 35.3 g/dL (ref 30.0–36.0)
MCV: 90.9 fL (ref 80.0–100.0)
Platelets: 427 10*3/uL — ABNORMAL HIGH (ref 150–400)
RBC: 4.92 MIL/uL (ref 3.87–5.11)
RDW: 11.9 % (ref 11.5–15.5)
WBC: 11.5 10*3/uL — ABNORMAL HIGH (ref 4.0–10.5)
nRBC: 0.2 % (ref 0.0–0.2)

## 2024-02-17 LAB — LIPASE, BLOOD: Lipase: 123 U/L — ABNORMAL HIGH (ref 11–51)

## 2024-02-17 LAB — HCG, SERUM, QUALITATIVE: Preg, Serum: NEGATIVE

## 2024-02-17 MED ORDER — HYDRALAZINE HCL 20 MG/ML IJ SOLN: 5.0000 mg | Freq: Three times a day (TID) | INTRAMUSCULAR | Status: DC | PRN

## 2024-02-17 MED ORDER — ONDANSETRON HCL 4 MG/2ML IJ SOLN
4.0000 mg | Freq: Once | INTRAMUSCULAR | Status: AC | PRN
  Administered 2024-02-17: 4 mg via INTRAVENOUS
  Filled 2024-02-17: qty 2

## 2024-02-17 MED ORDER — DOCUSATE SODIUM 100 MG PO CAPS
100.0000 mg | ORAL_CAPSULE | Freq: Two times a day (BID) | ORAL | Status: DC
  Administered 2024-02-17 – 2024-02-19 (×4): 100 mg via ORAL
  Filled 2024-02-17 (×5): qty 1

## 2024-02-17 MED ORDER — ENOXAPARIN SODIUM 40 MG/0.4ML IJ SOSY
40.0000 mg | PREFILLED_SYRINGE | INTRAMUSCULAR | Status: DC
  Administered 2024-02-19: 40 mg via SUBCUTANEOUS
  Filled 2024-02-17 (×3): qty 0.4

## 2024-02-17 MED ORDER — ONDANSETRON HCL 4 MG/2ML IJ SOLN: 4.0000 mg | Freq: Four times a day (QID) | INTRAMUSCULAR | Status: DC | PRN

## 2024-02-17 MED ORDER — LACTATED RINGERS IV BOLUS
500.0000 mL | Freq: Once | INTRAVENOUS | Status: AC
  Administered 2024-02-17: 500 mL via INTRAVENOUS

## 2024-02-17 MED ORDER — ONDANSETRON 4 MG PO TBDP: 4.0000 mg | ORAL_TABLET | Freq: Once | ORAL | Status: DC | PRN

## 2024-02-17 MED ORDER — LACTATED RINGERS IV BOLUS: 1000.0000 mL | Freq: Once | INTRAVENOUS | Status: DC

## 2024-02-17 MED ORDER — SODIUM CHLORIDE 0.9 % IV BOLUS
1000.0000 mL | Freq: Once | INTRAVENOUS | Status: AC
  Administered 2024-02-17: 1000 mL via INTRAVENOUS

## 2024-02-17 MED ORDER — SODIUM CHLORIDE 0.9 % IV SOLN: INTRAVENOUS | Status: DC

## 2024-02-17 MED ORDER — DIPHENHYDRAMINE HCL 50 MG/ML IJ SOLN
25.0000 mg | Freq: Four times a day (QID) | INTRAMUSCULAR | Status: DC | PRN
  Administered 2024-02-17: 25 mg via INTRAVENOUS
  Filled 2024-02-17: qty 1

## 2024-02-17 MED ORDER — ACETAMINOPHEN 650 MG RE SUPP: 650.0000 mg | Freq: Four times a day (QID) | RECTAL | Status: DC | PRN

## 2024-02-17 MED ORDER — ONDANSETRON HCL 4 MG PO TABS: 4.0000 mg | ORAL_TABLET | Freq: Four times a day (QID) | ORAL | Status: DC | PRN

## 2024-02-17 MED ORDER — MORPHINE SULFATE (PF) 4 MG/ML IV SOLN
4.0000 mg | Freq: Once | INTRAVENOUS | Status: AC
  Administered 2024-02-17: 4 mg via INTRAVENOUS
  Filled 2024-02-17: qty 1

## 2024-02-17 MED ORDER — ONDANSETRON HCL 4 MG/2ML IJ SOLN
4.0000 mg | Freq: Once | INTRAMUSCULAR | Status: AC
  Administered 2024-02-17: 4 mg via INTRAVENOUS
  Filled 2024-02-17: qty 2

## 2024-02-17 MED ORDER — OXYCODONE HCL 5 MG PO TABS
5.0000 mg | ORAL_TABLET | ORAL | Status: DC | PRN
  Administered 2024-02-17 – 2024-02-18 (×2): 5 mg via ORAL
  Filled 2024-02-17 (×3): qty 1

## 2024-02-17 MED ORDER — ALUM & MAG HYDROXIDE-SIMETH 200-200-20 MG/5ML PO SUSP
15.0000 mL | Freq: Four times a day (QID) | ORAL | Status: DC | PRN
  Administered 2024-02-17: 15 mL via ORAL
  Filled 2024-02-17: qty 30

## 2024-02-17 MED ORDER — ACETAMINOPHEN 325 MG PO TABS
650.0000 mg | ORAL_TABLET | Freq: Four times a day (QID) | ORAL | Status: DC | PRN
  Administered 2024-02-18 – 2024-02-19 (×4): 650 mg via ORAL
  Filled 2024-02-17 (×4): qty 2

## 2024-02-17 MED ORDER — ALPRAZOLAM 0.5 MG PO TABS
0.5000 mg | ORAL_TABLET | Freq: Every day | ORAL | Status: DC | PRN
  Administered 2024-02-17 – 2024-02-19 (×3): 0.5 mg via ORAL
  Filled 2024-02-17 (×3): qty 1

## 2024-02-17 MED ORDER — LACTATED RINGERS IV BOLUS
1000.0000 mL | Freq: Once | INTRAVENOUS | Status: AC
  Administered 2024-02-17: 1000 mL via INTRAVENOUS

## 2024-02-17 NOTE — ED Triage Notes (Signed)
 Pt arrives via [facility] from home. Per Report she has had n/v x 4 days. No pain, 118/72, hr 98, rr 20, 100% RA, CBG 201

## 2024-02-17 NOTE — Plan of Care (Signed)

## 2024-02-17 NOTE — ED Provider Triage Note (Signed)
 Emergency Medicine Provider Triage Evaluation Note  Teresa Hensley , a 48 y.o. female  was evaluated in triage.  Pt complains of nausea and vomiting. Patient reports that this has been ongoing for the last 3-4 days. No sick contacts. She is concerned about possible parasitic infection and has brought a sample of something in a small cup. Denies any shortness of breath, chest pain, or fever.  Review of Systems  Positive: As above Negative: As above  Physical Exam  BP 111/83   Pulse (!) 123   Temp (!) 97.5 F (36.4 C) (Oral)   Resp 19   LMP 01/20/2024 (Approximate)   SpO2 100%  Gen:   Awake, uncomfortable Resp:  Normal effort  MSK:   Moves extremities without difficulty  Other:    Medical Decision Making  Medically screening exam initiated at 3:54 AM.  Appropriate orders placed.  Ora Billing was informed that the remainder of the evaluation will be completed by another provider, this initial triage assessment does not replace that evaluation, and the importance of remaining in the ED until their evaluation is complete.  Patient with noted AKI and tachycardia, fluid bolus started from triage. No active vomiting on my assessment.   Necha Harries A, PA-C 02/17/24 [phone]

## 2024-02-17 NOTE — H&P (Signed)
 History and Physical    Teresa Hensley ZOX:[id number] DOB: 06/11/1976 DOA: 02/17/2024  PCP: Patient, No Pcp Per   Patient coming from: Home  I have personally briefly reviewed patient's old medical records in [facility]  Chief Complaint:  Intractable Nausea and vomiting  HPI: Teresa Hensley is a 48 y.o. female with PMH significant for ADHD, anxiety disorder, depression, history of parasitic infection presented in the ED with complaints of intractable nausea and vomiting for 4 days.  Patient denies any abdominal pain.  She denies any recent travel or eating outside,  states she started throwing up for last four days, She is not able to keep anything down. She denies any sick contacts.  Patient did reports going to [place] few months back for a treatment for parasitic infection in her face. She denies any fever, chills, cough, diarrhea.  ED Course: She is hemodynamically stable except tachycardia. Temp 97.5, HR 139, RR 19, BP 111/83, SpO2 100% on room air Labs include sodium 129, potassium 3.6, chloride 91, bicarb 19, glucose 185, BUN 76, creatinine 1.90, calcium 9.9, anion gap 9, alkaline phosphatase 86, lipase 123, AST 32, ALT 37, total protein 9.3, total bilirubin 2.5, WBC 11.5, hemoglobin 15.8, hematocrit 44.7, platelet 427, pregnancy test, negative UA unremarkable. CT A/P: Hepatic steatosis.11 mm appendicolith without associated appendicitis.Anterior subserosal fibroid near the fundus measures 6.4 x 6.4 x 5.6 cm. No acute or other focal lesion to explain the patient's symptoms.  Review of Systems:  Review of Systems  Constitutional: Negative.   HENT: Negative.    Eyes: Negative.   Respiratory: Negative.    Cardiovascular: Negative.   Gastrointestinal:  Positive for heartburn, nausea and vomiting.  Genitourinary: Negative.   Musculoskeletal: Negative.   Neurological: Negative.   Endo/Heme/Allergies: Negative.   Psychiatric/Behavioral:  Positive for depression. The patient is  nervous/anxious and has insomnia.      . Past Medical History:  Diagnosis Date   ADHD (attention deficit hyperactivity disorder)    Anxiety    Depression    Seizures (HCC)    as child   Tapeworm infection    Tapeworm infection     Past Surgical History:  Procedure Laterality Date   pelvic mass removal     TONSILLECTOMY       reports that she has never smoked. She has never used smokeless tobacco. She reports current alcohol use. She reports that she does not use drugs.  No Known Allergies  History reviewed. No pertinent family history. Family history reviewed and not pertinent .  Prior to Admission medications   Medication Sig Start Date End Date Taking? Authorizing Provider  Vilazodone HCl (VIIBRYD) 40 MG TABS Take 40 mg by mouth daily. 01/19/24  Yes [provider]  ALPRAZolam  (XANAX ) 0.5 MG tablet Take by mouth as needed.    [provider]  amphetamine -dextroamphetamine  (ADDERALL ) 30 MG tablet Take 10 mg by mouth daily.    [provider]  ibuprofen (ADVIL) 200 MG tablet Take 600 mg by mouth every 6 (six) hours as needed for mild pain.    [provider]    Physical Exam: Vitals:   02/17/24 0545 02/17/24 0639 02/17/24 1044 02/17/24 1336  BP:  (!) 146/99 (!) 136/90 (!) 136/90  Pulse:  (!) 105 98 98  Resp: 14 (!) 22 18 18   Temp:   98.4 F (36.9 C) 98.4 F (36.9 C)  TempSrc:   Oral Oral  SpO2:  99% 99%   Weight:  60.2 kg  Height:    5\' 6"  (1.676 m)    Constitutional: Appears calm, comfortable, not in any acute distress. Vitals:   02/17/24 0545 02/17/24 0639 02/17/24 1044 02/17/24 1336  BP:  (!) 146/99 (!) 136/90 (!) 136/90  Pulse:  (!) 105 98 98  Resp: 14 (!) 22 18 18   Temp:   98.4 F (36.9 C) 98.4 F (36.9 C)  TempSrc:   Oral Oral  SpO2:  99% 99%   Weight:    60.2 kg  Height:    5\' 6"  (1.676 m)   Eyes: PERRL, lids and conjunctivae normal ENMT: Mucous membranes are moist. Posterior pharynx clear of any exudate or  lesions. Neck: Normal, supple, no masses, no thyromegaly Respiratory: clear to auscultation bilaterally, no wheezing, no crackles. Normal respiratory effort.  Cardiovascular: S1 S2 heard, regular rate and rhythm, no murmurs / rubs / gallops. No extremity edema.  Abdomen: Soft, no tenderness, no masses palpated. No hepatosplenomegaly. Bowel sounds positive.  Musculoskeletal: no clubbing / cyanosis. No joint deformity upper and lower extremities. Good ROM, no contractures. Normal muscle tone.  Skin: no rashes, lesions, ulcers. No induration Neurologic: CN 2-12 grossly intact. Sensation intact, DTR normal. Strength 5/5 in all 4.  Psychiatric: Normal judgment and insight. Alert and oriented x 3. Normal mood.   Labs on Admission: I have personally reviewed following labs and imaging studies  CBC: Recent Labs  Lab 02/17/24 0138  WBC 11.5*  HGB 15.8*  HCT 44.7  MCV 90.9  PLT 427*   Basic Metabolic Panel: Recent Labs  Lab 02/17/24 0138  NA 129*  K 3.6  CL 91*  CO2 19*  GLUCOSE 185*  BUN 76*  CREATININE 1.90*  CALCIUM 9.9   GFR: Estimated Creatinine Clearance: 34.6 mL/min (A) (by C-G formula based on SCr of 1.9 mg/dL (H)). Liver Function Tests: Recent Labs  Lab 02/17/24 0138  AST 32  ALT 37  ALKPHOS 84  BILITOT 2.5*  PROT 9.3*  ALBUMIN 5.8*   Recent Labs  Lab 02/17/24 0138  LIPASE 123*   No results for input(s): "AMMONIA" in the last 168 hours. Coagulation Profile: No results for input(s): "INR", "PROTIME" in the last 168 hours. Cardiac Enzymes: No results for input(s): "CKTOTAL", "CKMB", "CKMBINDEX", "TROPONINI" in the last 168 hours. BNP (last 3 results) No results for input(s): "PROBNP" in the last 8760 hours. HbA1C: No results for input(s): "HGBA1C" in the last 72 hours. CBG: No results for input(s): "GLUCAP" in the last 168 hours. Lipid Profile: No results for input(s): "CHOL", "HDL", "LDLCALC", "TRIG", "CHOLHDL", "LDLDIRECT" in the last 72 hours. Thyroid   Function Tests: No results for input(s): "TSH", "T4TOTAL", "FREET4", "T3FREE", "THYROIDAB" in the last 72 hours. Anemia Panel: No results for input(s): "VITAMINB12", "FOLATE", "FERRITIN", "TIBC", "IRON", "RETICCTPCT" in the last 72 hours. Urine analysis:    Component Value Date/Time   COLORURINE AMBER (A) 02/17/2024 0534   APPEARANCEUR HAZY (A) 02/17/2024 0534   LABSPEC 1.023 02/17/2024 0534   PHURINE 5.0 02/17/2024 0534   GLUCOSEU NEGATIVE 02/17/2024 0534   HGBUR NEGATIVE 02/17/2024 0534   BILIRUBINUR NEGATIVE 02/17/2024 0534   KETONESUR 5 (A) 02/17/2024 0534   PROTEINUR 100 (A) 02/17/2024 0534   UROBILINOGEN 0.2 07/27/2013 1629   NITRITE NEGATIVE 02/17/2024 0534   LEUKOCYTESUR NEGATIVE 02/17/2024 0534    Radiological Exams on Admission: CT ABDOMEN PELVIS WO CONTRAST Result Date: 02/17/2024 CLINICAL DATA:  Abdominal pain, acute, nonlocalized. Nausea and vomiting for 4 days. EXAM: CT ABDOMEN AND PELVIS WITHOUT CONTRAST  TECHNIQUE: Multidetector CT imaging of the abdomen and pelvis was performed following the standard protocol without IV contrast. RADIATION DOSE REDUCTION: This exam was performed according to the departmental dose-optimization program which includes automated exposure control, adjustment of the mA and/or kV according to patient size and/or use of iterative reconstruction technique. COMPARISON:  Right upper quadrant ultrasound 09/10/2018 FINDINGS: Lower chest: The lung bases are clear without focal nodule, mass, or airspace disease. The heart size is normal. No significant pleural or pericardial effusion present. Hepatobiliary: Diffuse fatty infiltration of the liver is present. There is some sparing along the falciform ligament. The common bile duct and gallbladder are. Pancreas: Unremarkable. No pancreatic ductal dilatation or surrounding inflammatory changes. Spleen: Normal in size without focal abnormality. Adrenals/Urinary Tract: Adrenal glands are normal bilaterally.  Kidneys are unremarkable. No stone or obstruction is present. The ureters are within normal limits. The urinary bladder is normal, mostly collapsed. Stomach/Bowel: Stomach and duodenum within normal limits. Small bowel is unremarkable. The terminal ileum is normal. 11 mm appendicolith is present. The appendix is normal. The ascending and transverse colon are within normal limits. The descending and sigmoid colon are normal. Vascular/Lymphatic: Minimal atherosclerotic changes are present in the aorta and branch vessels. No aneurysm is present. No significant adenopathy is present. Reproductive: An anterior subserosal fibroid near the fundus measures 6.4 x 6.4 x 5.6 cm. Uterus and adnexa are otherwise within. Other: None Musculoskeletal: Vertebral body heights and alignment are normal. No focal osseous lesions are present. Bony pelvis is normal. Hips are located and within normal limits bilaterally. IMPRESSION: 1. Hepatic steatosis. 2. 11 mm appendicolith without associated appendicitis. 3. Anterior subserosal fibroid near the fundus measures 6.4 x 6.4 x 5.6 cm. 4. No acute or other focal lesion to explain the patient's symptoms. Electronically Signed   By: Audree Leas M.D.   On: 02/17/2024 09:04    EKG: Independently reviewed.  Sinus tachycardia, ventricular premature complexes.  Assessment/Plan Principal Problem:   AKI (acute kidney injury) (HCC) Active Problems:   Psychiatric illness   ADHD (attention deficit hyperactivity disorder)   Anxiety disorder  Acute kidney injury: Likely prerenal in the setting of intractable nausea and vomiting. Baseline serum creatinine 0.7 ,  presented with creatinine 1.90. Avoid nephrotoxic medications. Continue IV hydration. Monitor serum creatinine.  Elevated lipase: Patient denies any abdominal pain but reports intractable nausea and vomiting. Continue IV Zofran  as needed for nausea and vomiting. Monitor serum lipase.  Hypertension: Blood pressure  remains slightly elevated.   Patient denies taking any blood pressure medication. Continue hydralazine as needed.  Anxiety disorder: Continue Xanax  0.5 mg nightly.  Hyponatremia: Likely due to dehydration in the setting of nausea and vomiting. Continue normal saline infusion.  Monitor serum sodium  Leukocytosis: Likely reactive.  There is no signs of infection.  History of parasitic infections: Patient reports history of parasitic infection in the past and been treated multiple times.   DVT prophylaxis: Lovenox  Code Status: Full code Family Communication: No family at bed side Disposition Plan:    Status is: Inpatient Remains inpatient appropriate because: Admitted for  AKI likely due to dehydration in the setting of nausea and vomiting.   Consults called: Gnereal Surgery Admission status: Inpatient   Magdalene School MD [facility]   If 7PM-7AM, please contact night-coverage   02/17/2024, 2:17 PM

## 2024-02-17 NOTE — ED Provider Notes (Signed)
 [place] EMERGENCY DEPARTMENT AT [facility] Provider Note   CSN: [id number] Arrival date & time: 02/17/24  0127     History  Chief Complaint  Patient presents with   Emesis   HPI Teresa Hensley is a 48 y.o. female presenting for emesis.  Started 4 days ago.  Denies abdominal pain.  Denies nausea and diarrhea.  States she just cannot keep anything down.  Denies fever and chills and urinary symptoms.  Denies sick contacts or recent long trips.  Patient did report that she did go to the [place] a few months ago for a facial MRI due to concerns for parasite or infection in her face.  Past Medical History:  Diagnosis Date   ADHD (attention deficit hyperactivity disorder)    Anxiety    Depression    Seizures (HCC)    as child   Tapeworm infection    Tapeworm infection       Emesis      Home Medications Prior to Admission medications   Medication Sig Start Date End Date Taking? Authorizing Provider  ALPRAZolam  (XANAX ) 0.5 MG tablet Take 0.5 mg by mouth daily as needed for anxiety.   Yes [provider]  amphetamine -dextroamphetamine  (ADDERALL ) 30 MG tablet Take 10 mg by mouth daily.   Yes [provider]  ibuprofen (ADVIL) 200 MG tablet Take 400 mg by mouth every 6 (six) hours as needed for mild pain (pain score 1-3) or moderate pain (pain score 4-6).   Yes [provider]  Vilazodone HCl (VIIBRYD) 40 MG TABS Take 40 mg by mouth daily. 01/19/24  Yes [provider]      Allergies    Patient has no known allergies.    Review of Systems   Review of Systems  Gastrointestinal:  Positive for vomiting.    Physical Exam Updated Vital Signs BP (!) 136/90 (BP Location: Right Arm)   Pulse 98   Temp 98.4 F (36.9 C) (Oral)   Resp 18   LMP 01/20/2024 (Approximate) Comment: negative HCG test 02-17-2024  SpO2 99%  Physical Exam Vitals and nursing note reviewed.  HENT:     Head: Normocephalic and atraumatic.      Mouth/Throat:     Mouth: Mucous membranes are moist.  Eyes:     General:        Right eye: No discharge.        Left eye: No discharge.     Conjunctiva/sclera: Conjunctivae normal.  Cardiovascular:     Rate and Rhythm: Regular rhythm. Tachycardia present.     Pulses: Normal pulses.     Heart sounds: Normal heart sounds.  Pulmonary:     Effort: Pulmonary effort is normal.     Breath sounds: Normal breath sounds.  Abdominal:     General: Abdomen is flat.     Palpations: Abdomen is soft.     Tenderness: There is no abdominal tenderness.  Skin:    General: Skin is warm and dry.  Neurological:     General: No focal deficit present.  Psychiatric:        Mood and Affect: Mood normal.     ED Results / Procedures / Treatments   Labs (all labs ordered are listed, but only abnormal results are displayed) Labs Reviewed  LIPASE, BLOOD - Abnormal; Notable for the following components:      Result Value   Lipase 123 (*)    All other components within normal limits  COMPREHENSIVE METABOLIC PANEL WITH  GFR - Abnormal; Notable for the following components:   Sodium 129 (*)    Chloride 91 (*)    CO2 19 (*)    Glucose, Bld 185 (*)    BUN 76 (*)    Creatinine, Ser 1.90 (*)    Total Protein 9.3 (*)    Albumin 5.8 (*)    Total Bilirubin 2.5 (*)    GFR, Estimated 33 (*)    Anion gap 19 (*)    All other components within normal limits  CBC - Abnormal; Notable for the following components:   WBC 11.5 (*)    Hemoglobin 15.8 (*)    Platelets 427 (*)    All other components within normal limits  URINALYSIS, ROUTINE W REFLEX MICROSCOPIC - Abnormal; Notable for the following components:   Color, Urine AMBER (*)    APPearance HAZY (*)    Ketones, ur 5 (*)    Protein, ur 100 (*)    All other components within normal limits  HCG, SERUM, QUALITATIVE  HIV ANTIBODY (ROUTINE TESTING W REFLEX)    EKG EKG Interpretation Date/Time:  Monday Feb 17 2024 02:10:42 EDT Ventricular Rate:  127 PR  Interval:  92 QRS Duration:  67 QT Interval:  292 QTC Calculation: 425 R Axis:   70  Text Interpretation: Sinus tachycardia Ventricular premature complex Abnormal R-wave progression, early transition Borderline repolarization abnormality Baseline wander in lead(s) II III aVF Confirmed by Angela Kell [phone]) on 02/17/2024 7:47:31 AM  Radiology CT ABDOMEN PELVIS WO CONTRAST Result Date: 02/17/2024 CLINICAL DATA:  Abdominal pain, acute, nonlocalized. Nausea and vomiting for 4 days. EXAM: CT ABDOMEN AND PELVIS WITHOUT CONTRAST TECHNIQUE: Multidetector CT imaging of the abdomen and pelvis was performed following the standard protocol without IV contrast. RADIATION DOSE REDUCTION: This exam was performed according to the departmental dose-optimization program which includes automated exposure control, adjustment of the mA and/or kV according to patient size and/or use of iterative reconstruction technique. COMPARISON:  Right upper quadrant ultrasound 09/10/2018 FINDINGS: Lower chest: The lung bases are clear without focal nodule, mass, or airspace disease. The heart size is normal. No significant pleural or pericardial effusion present. Hepatobiliary: Diffuse fatty infiltration of the liver is present. There is some sparing along the falciform ligament. The common bile duct and gallbladder are. Pancreas: Unremarkable. No pancreatic ductal dilatation or surrounding inflammatory changes. Spleen: Normal in size without focal abnormality. Adrenals/Urinary Tract: Adrenal glands are normal bilaterally. Kidneys are unremarkable. No stone or obstruction is present. The ureters are within normal limits. The urinary bladder is normal, mostly collapsed. Stomach/Bowel: Stomach and duodenum within normal limits. Small bowel is unremarkable. The terminal ileum is normal. 11 mm appendicolith is present. The appendix is normal. The ascending and transverse colon are within normal limits. The descending and sigmoid colon are  normal. Vascular/Lymphatic: Minimal atherosclerotic changes are present in the aorta and branch vessels. No aneurysm is present. No significant adenopathy is present. Reproductive: An anterior subserosal fibroid near the fundus measures 6.4 x 6.4 x 5.6 cm. Uterus and adnexa are otherwise within. Other: None Musculoskeletal: Vertebral body heights and alignment are normal. No focal osseous lesions are present. Bony pelvis is normal. Hips are located and within normal limits bilaterally. IMPRESSION: 1. Hepatic steatosis. 2. 11 mm appendicolith without associated appendicitis. 3. Anterior subserosal fibroid near the fundus measures 6.4 x 6.4 x 5.6 cm. 4. No acute or other focal lesion to explain the patient's symptoms. Electronically Signed   By: Audree Leas M.D.   On:  02/17/2024 09:04    Procedures .Critical Care  Performed by: Janalee Mcmurray, PA-C Authorized by: Janalee Mcmurray, PA-C   Critical care provider statement:    Critical care time (minutes):  30   Critical care was necessary to treat or prevent imminent or life-threatening deterioration of the following conditions:  Renal failure (AKI requiring fluid resuscitation)   Critical care was time spent personally by me on the following activities:  Development of treatment plan with patient or surrogate, discussions with consultants, evaluation of patient's response to treatment, examination of patient, ordering and review of laboratory studies, ordering and review of radiographic studies, ordering and performing treatments and interventions, pulse oximetry, re-evaluation of patient's condition and review of old charts     Medications Ordered in ED Medications  ALPRAZolam  (XANAX ) tablet 0.5 mg (has no administration in time range)  enoxaparin  (LOVENOX ) injection 40 mg (40 mg Subcutaneous Patient Refused/Not Given 02/17/24 1306)  0.9 %  sodium chloride  infusion ( Intravenous New Bag/Given 02/17/24 1223)  acetaminophen  (TYLENOL ) tablet  650 mg (has no administration in time range)    Or  acetaminophen  (TYLENOL ) suppository 650 mg (has no administration in time range)  oxyCODONE (Oxy IR/ROXICODONE) immediate release tablet 5 mg (has no administration in time range)  docusate sodium (COLACE) capsule 100 mg (100 mg Oral Given 02/17/24 1220)  ondansetron  (ZOFRAN ) tablet 4 mg (has no administration in time range)    Or  ondansetron  (ZOFRAN ) injection 4 mg (has no administration in time range)  alum & mag hydroxide-simeth (MAALOX/MYLANTA) 200-200-20 MG/5ML suspension 15 mL (15 mLs Oral Given 02/17/24 1220)  ondansetron  (ZOFRAN ) injection 4 mg (4 mg Intravenous Given 02/17/24 0236)  lactated ringers bolus 1,000 mL (0 mLs Intravenous Stopped 02/17/24 0535)  lactated ringers bolus 500 mL (0 mLs Intravenous Stopped 02/17/24 0648)  sodium chloride  0.9 % bolus 1,000 mL (1,000 mLs Intravenous New Bag/Given 02/17/24 0737)  ondansetron  (ZOFRAN ) injection 4 mg (4 mg Intravenous Given 02/17/24 0912)  morphine (PF) 4 MG/ML injection 4 mg (4 mg Intravenous Given 02/17/24 0911)    ED Course/ Medical Decision Making/ A&P                                 Medical Decision Making Amount and/or Complexity of Data Reviewed Labs: ordered. Radiology: ordered.  Risk Prescription drug management. Decision regarding hospitalization.   Initial Impression and Ddx 48 year old well-appearing female presenting for nausea and vomiting.  Exam notable for tachycardia.  DDx includes intra-abdominal infection, dehydration, AKI, bowel obstruction, other. Patient PMH that increases complexity of ED encounter:  none  Interpretation of Diagnostics I independent reviewed and interpreted the labs as followed: Elevated BUN and creatinine, hyponatremia, elevated lipase  - I independently visualized the following imaging with scope of interpretation limited to determining acute life threatening conditions related to emergency care: CT ab/pelvis, which revealed 11 mm  appendicolith without associated appendicitis, other findings are above.  Shared them with patient.  -I personally reviewed and interpreted EKG which revealed sinus tachycardia  Patient Reassessment and Ultimate Disposition/Management Workup suggestive of AKI likely secondary to persistent nausea and vomiting.  Treated with IV fluid resuscitation.  CT was largely reassuring but did reach out to general surgery and spoke to PA [facility] given the sizable appendicolith.  She states that her team would be available in consult to potentially see her this afternoon.  Admitted to hospital service with Dr. Elsworth Halt.  Patient management  required discussion with the following services or consulting groups:  Hospitalist Service and General/Trauma Surgery  Complexity of Problems Addressed Acute complicated illness or Injury  Additional Data Reviewed and Analyzed Further history obtained from: Past medical history and medications listed in the EMR and Prior ED visit notes  Patient Encounter Risk Assessment Consideration of hospitalization         Final Clinical Impression(s) / ED Diagnoses Final diagnoses:  Nausea and vomiting, unspecified vomiting type    Rx / DC Orders ED Discharge Orders     None         Janalee Mcmurray, PA-C 02/17/24 1333    Burnette Carte, MD 02/17/24 1524

## 2024-02-17 NOTE — ED Triage Notes (Signed)
 Vomiting since Thursday and cannot keep anything down. Pt states that she is on antibiotics for a "staph infection in my nose, from when the doctors years ago did surgery in my mouth" Denies diarrhea, or fevers. She c/o pain in the mouth, nose and throat. Dry heaves in triage.

## 2024-02-18 ENCOUNTER — Inpatient Hospital Stay (HOSPITAL_COMMUNITY)

## 2024-02-18 DIAGNOSIS — N179 Acute kidney failure, unspecified: Secondary | ICD-10-CM | POA: Diagnosis not present

## 2024-02-18 LAB — CBC
HCT: 33.3 % — ABNORMAL LOW (ref 36.0–46.0)
Hemoglobin: 11.5 g/dL — ABNORMAL LOW (ref 12.0–15.0)
MCH: 32.4 pg (ref 26.0–34.0)
MCHC: 34.5 g/dL (ref 30.0–36.0)
MCV: 93.8 fL (ref 80.0–100.0)
Platelets: 242 10*3/uL (ref 150–400)
RBC: 3.55 MIL/uL — ABNORMAL LOW (ref 3.87–5.11)
RDW: 12 % (ref 11.5–15.5)
WBC: 4.9 10*3/uL (ref 4.0–10.5)
nRBC: 0 % (ref 0.0–0.2)

## 2024-02-18 LAB — COMPREHENSIVE METABOLIC PANEL WITH GFR
ALT: 30 U/L (ref 0–44)
AST: 49 U/L — ABNORMAL HIGH (ref 15–41)
Albumin: 3.7 g/dL (ref 3.5–5.0)
Alkaline Phosphatase: 47 U/L (ref 38–126)
Anion gap: 10 (ref 5–15)
BUN: 20 mg/dL (ref 6–20)
CO2: 22 mmol/L (ref 22–32)
Calcium: 8.1 mg/dL — ABNORMAL LOW (ref 8.9–10.3)
Chloride: 101 mmol/L (ref 98–111)
Creatinine, Ser: 0.54 mg/dL (ref 0.44–1.00)
GFR, Estimated: >60 mL/min (ref >60)
Glucose, Bld: 98 mg/dL (ref 70–99)
Potassium: 3 mmol/L — ABNORMAL LOW (ref 3.5–5.1)
Sodium: 133 mmol/L — ABNORMAL LOW (ref 135–145)
Total Bilirubin: 2.2 mg/dL — ABNORMAL HIGH (ref 0.0–1.2)
Total Protein: 6.1 g/dL — ABNORMAL LOW (ref 6.5–8.1)

## 2024-02-18 LAB — BASIC METABOLIC PANEL WITH GFR
Anion gap: 9 (ref 5–15)
BUN: 15 mg/dL (ref 6–20)
CO2: 27 mmol/L (ref 22–32)
Calcium: 8.7 mg/dL — ABNORMAL LOW (ref 8.9–10.3)
Chloride: 100 mmol/L (ref 98–111)
Creatinine, Ser: 0.54 mg/dL (ref 0.44–1.00)
GFR, Estimated: >60 mL/min (ref >60)
Glucose, Bld: 112 mg/dL — ABNORMAL HIGH (ref 70–99)
Potassium: 3.4 mmol/L — ABNORMAL LOW (ref 3.5–5.1)
Sodium: 136 mmol/L (ref 135–145)

## 2024-02-18 LAB — PHOSPHORUS
Phosphorus: 1.4 mg/dL — ABNORMAL LOW (ref 2.5–4.6)
Phosphorus: 2.4 mg/dL — ABNORMAL LOW (ref 2.5–4.6)

## 2024-02-18 LAB — MAGNESIUM: Magnesium: 2.3 mg/dL (ref 1.7–2.4)

## 2024-02-18 LAB — LIPASE, BLOOD
Lipase: 150 U/L — ABNORMAL HIGH (ref 11–51)
Lipase: 179 U/L — ABNORMAL HIGH (ref 11–51)

## 2024-02-18 LAB — HIV ANTIBODY (ROUTINE TESTING W REFLEX): HIV Screen 4th Generation wRfx: NONREACTIVE

## 2024-02-18 MED ORDER — SODIUM CHLORIDE 0.9 % IV SOLN
INTRAVENOUS | Status: DC
Start: 2024-02-18 — End: 2024-02-19

## 2024-02-18 MED ORDER — POLYETHYLENE GLYCOL 3350 17 G PO PACK: 17.0000 g | PACK | Freq: Every day | ORAL | Status: DC | PRN

## 2024-02-18 MED ORDER — POTASSIUM PHOSPHATES 15 MMOLE/5ML IV SOLN
15.0000 mmol | Freq: Once | INTRAVENOUS | Status: AC
  Administered 2024-02-18: 15 mmol via INTRAVENOUS
  Filled 2024-02-18: qty 5

## 2024-02-18 MED ORDER — BISACODYL 10 MG RE SUPP
10.0000 mg | Freq: Every day | RECTAL | Status: DC | PRN
  Administered 2024-02-18 – 2024-02-19 (×2): 10 mg via RECTAL
  Filled 2024-02-18 (×2): qty 1

## 2024-02-18 NOTE — TOC Initial Note (Addendum)
 Transition of Care [facility]) - Initial/Assessment Note    Patient Details  Name: Teresa Hensley MRN: [id number] Date of Birth: Mar 31, 1976  Transition of Care [facility]) CM/SW Contact:    Kathryn Parish, RN Phone Number: 02/18/2024, 12:12 PM  Clinical Narrative:                 spoke w/ pt in room; pt says lives at home alone; pt verified insurance/PCP(Ashley Aisha Ali, NP); denied SDOH risks; pt says does not have DME, HH services, or home oxygen; patient will uber home at discharge.  Patient requested infectious diease to consult; NCM sent  secure chat to MD requesting. Dr. Betsey Brow does not feel ID consult is warranted TOC will follow    Expected Discharge Plan: Home/Self Care Barriers to Discharge: Continued Medical Work up   Patient Goals and CMS Choice     Choice offered to / list presented to : Parent      Expected Discharge Plan and Services In-house Referral: NA Discharge Planning Services: CM Consult Post Acute Care Choice: NA Living arrangements for the past 2 months: Single Family Home                 DME Arranged: N/A DME Agency: NA       HH Arranged: NA HH Agency: NA        Prior Living Arrangements/Services Living arrangements for the past 2 months: Single Family Home Lives with:: Self Patient language and need for interpreter reviewed:: Yes Do you feel safe going back to the place where you live?: Yes      Need for Family Participation in Patient Care: No (Comment) Care giver support system in place?: No (comment) (Family out of town)   Criminal Activity/Legal Involvement Pertinent to Current Situation/Hospitalization: No - Comment as needed  Activities of Daily Living   ADL Screening (condition at time of admission) Independently performs ADLs?: Yes (appropriate for developmental age) Is the patient deaf or have difficulty hearing?: No Does the patient have difficulty seeing, even when wearing glasses/contacts?: No Does the patient have difficulty  concentrating, remembering, or making decisions?: No  Permission Sought/Granted Permission sought to share information with : Case Manager Permission granted to share information with : Yes, Verbal Permission Granted  Share Information with NAME: Darla     Permission granted to share info w Relationship: mother  Permission granted to share info w Contact Information: [phone]  Emotional Assessment Appearance:: Appears stated age Attitude/Demeanor/Rapport: Engaged Affect (typically observed): Appropriate Orientation: : Oriented to Self, Oriented to Place, Oriented to  Time, Oriented to Situation Alcohol / Substance Use: Not Applicable Psych Involvement: No (comment)  Admission diagnosis:  AKI (acute kidney injury) (HCC) [N17.9] Patient Active Problem List   Diagnosis Date Noted   AKI (acute kidney injury) (HCC) 02/17/2024   Starvation ketoacidosis 09/10/2018   Psychiatric illness 09/10/2018   ADHD (attention deficit hyperactivity disorder) 12/26/2011   Anxiety disorder 12/26/2011   PCP:  Patient, No Pcp Per Pharmacy:   [id number] Dagmar Drones, [place] - [address] AT [facility] [address] [place] [address] Phone: [phone] Fax: [phone]  [id number] Jonette Nestle, [place] - [address] AT [facility] [address] [place] [address] Phone: [phone] Fax: [phone]     Social Drivers of Health (SDOH) Social History: SDOH Screenings   Food Insecurity: No Food Insecurity (02/17/2024)  Housing: Unknown (02/17/2024)  Transportation Needs:  No Transportation Needs (02/17/2024)  Utilities: Not At Risk (02/17/2024)  Depression (PHQ2-9): High Risk (04/04/2023)  Financial Resource Strain: Low Risk  (08/07/2018)  Physical Activity: Sufficiently Active (08/07/2018)  Social Connections: Unknown (08/07/2018)  Stress: Stress Concern Present (08/07/2018)  Tobacco Use: Low  Risk  (02/17/2024)  Recent Concern: Tobacco Use - Medium Risk (12/03/2023)   Received from [facility]   SDOH Interventions:     Readmission Risk Interventions    02/18/2024   12:09 PM  Readmission Risk Prevention Plan  Post Dischage Appt Complete  Medication Screening Complete  Transportation Screening Complete

## 2024-02-18 NOTE — Progress Notes (Signed)
 PROGRESS NOTE  Teresa Hensley  NGE:[id number] DOB: [birth year] DOA: 02/17/2024 PCP: Gavin Kast, FNP  Consultants  Brief Narrative: 48 y.o. female with  PMH significant for ADHD, anxiety disorder, depression, history of parasitic infection presented in the ED with complaints of intractable nausea and vomiting for 4 days.  Patient denies any abdominal pain.  She denies any recent travel or eating outside,  states she started throwing up for last four days, She is not able to keep anything down. She denies any sick contacts.  Patient did reports going to [place] few months back for a treatment for parasitic infection in her face. She denies any fever, chills, cough, diarrhea.    Assessment & Plan: Acute kidney injury: Likely prerenal in the setting of intractable nausea and vomiting. Baseline serum creatinine 0.7 ,  presented with creatinine 1.90. Downtrending. Avoid nephrotoxic medications. Continue IV hydration. Monitor serum creatinine.   Elevated lipase: Patient denies any abdominal pain but reports intractable nausea and vomiting. Continue IV Zofran  as needed for nausea and vomiting. Monitor serum lipase. Remains high, recheck in AM.     Hypertension: Blood pressure remains slightly elevated.   Patient denies taking any blood pressure medication. Continue hydralazine as needed.   Anxiety disorder: Continue Xanax  0.5 mg nightly.   Hyponatremia: Likely due to dehydration in the setting of nausea and vomiting. Continue normal saline infusion.  Monitor serum sodium   Leukocytosis: Likely reactive.  There is no signs of infection. Resolved.    History of parasitic infections: Patient reports history of parasitic infection in the past and been treated multiple times. Very perserverative on this.  Has MRI with her from [place] with report of possible linear infection Right eyelid and cheek.   Also with personal concern for filiarisis.  Will add peripheral smear and send skin  scrapings for pathology.  - Fortunately WBC normal.  Will obtain AM diff to assess eosinophils.     DVT prophylaxis:  enoxaparin  (LOVENOX ) injection 40 mg Start: 02/17/24 1200  Code Status:   Code Status: Full Code Level of care: Med-Surg Status is: Inpatient Dispo:  Likely able to DC home tomorrow if lipase trends downward and creatinine remains low    Subjective: Very anxious about possible parasitic infection.  Difficult to redirect from this to discuss her current hospitalization, nausea/vomiting, etc.  Has MRIs from [place] and home tests for plethora of infectious diseases.   Objective: Vitals:   02/17/24 1843 02/17/24 2217 02/18/24 0528 02/18/24 1433  BP: (!) 130/93 113/83 110/85 111/76  Pulse: (!) 103 91 84 95  Resp: 16 18 17 18   Temp: 98.1 F (36.7 C) 98 F (36.7 C) 98.2 F (36.8 C) 98.9 F (37.2 C)  TempSrc: Oral Oral Oral   SpO2: 100% 100% 100% 100%  Weight:      Height:        Intake/Output Summary (Last 24 hours) at 02/18/2024 1651 Last data filed at 02/18/2024 1517 Gross per 24 hour  Intake 1188.82 ml  Output --  Net 1188.82 ml   Filed Weights   02/17/24 1336  Weight: 60.2 kg   Body mass index is 21.42 kg/m.  Gen: 48 y.o. female in no apparent distress.  Nontoxic Pulm: Non-labored breathing.  Clear to auscultation bilaterally.  CV: Regular rate and rhythm. No murmur, rub, or gallop. No JVD GI: Abdomen soft, non-tender, non-distended, with normoactive bowel sounds.  Ext: Warm, no deformities, no pedal edema Skin: No rashes, lesions no ulcers Neuro: Alert and oriented. No focal  neurological deficits. Psych: Calm.  Did become tearful when discussing her prior work-up for parasitosis and fact she has "never received treatment."  Verbose and difficult to redirect   I have personally reviewed the following labs and images: CBC: Recent Labs  Lab 02/17/24 0138 02/18/24 0804  WBC 11.5* 4.9  HGB 15.8* 11.5*  HCT 44.7 33.3*  MCV 90.9 93.8  PLT 427*  242   BMP &GFR Recent Labs  Lab 02/17/24 0138 02/18/24 0431 02/18/24 1547  NA 129* 133* 136  K 3.6 3.0* 3.4*  CL 91* 101 100  CO2 19* 22 27  GLUCOSE 185* 98 112*  BUN 76* 20 15  CREATININE 1.90* 0.54 0.54  CALCIUM 9.9 8.1* 8.7*  MG  --  2.3  --   PHOS  --  1.4* 2.4*   Estimated Creatinine Clearance: 82.3 mL/min (by C-G formula based on SCr of 0.54 mg/dL). Liver & Pancreas: Recent Labs  Lab 02/17/24 0138 02/18/24 0431  AST 32 49*  ALT 37 30  ALKPHOS 84 47  BILITOT 2.5* 2.2*  PROT 9.3* 6.1*  ALBUMIN 5.8* 3.7   Recent Labs  Lab 02/17/24 0138 02/18/24 0431 02/18/24 1547  LIPASE 123* 150* 179*   No results for input(s): "AMMONIA" in the last 168 hours. Diabetic: No results for input(s): "HGBA1C" in the last 72 hours. No results for input(s): "GLUCAP" in the last 168 hours. Cardiac Enzymes: No results for input(s): "CKTOTAL", "CKMB", "CKMBINDEX", "TROPONINI" in the last 168 hours. No results for input(s): "PROBNP" in the last 8760 hours. Coagulation Profile: No results for input(s): "INR", "PROTIME" in the last 168 hours. Thyroid  Function Tests: No results for input(s): "TSH", "T4TOTAL", "FREET4", "T3FREE", "THYROIDAB" in the last 72 hours. Lipid Profile: No results for input(s): "CHOL", "HDL", "LDLCALC", "TRIG", "CHOLHDL", "LDLDIRECT" in the last 72 hours. Anemia Panel: No results for input(s): "VITAMINB12", "FOLATE", "FERRITIN", "TIBC", "IRON", "RETICCTPCT" in the last 72 hours. Urine analysis:    Component Value Date/Time   COLORURINE AMBER (A) 02/17/2024 0534   APPEARANCEUR HAZY (A) 02/17/2024 0534   LABSPEC 1.023 02/17/2024 0534   PHURINE 5.0 02/17/2024 0534   GLUCOSEU NEGATIVE 02/17/2024 0534   HGBUR NEGATIVE 02/17/2024 0534   BILIRUBINUR NEGATIVE 02/17/2024 0534   KETONESUR 5 (A) 02/17/2024 0534   PROTEINUR 100 (A) 02/17/2024 0534   UROBILINOGEN 0.2 07/27/2013 1629   NITRITE NEGATIVE 02/17/2024 0534   LEUKOCYTESUR NEGATIVE 02/17/2024 0534    Sepsis Labs: Invalid input(s): "PROCALCITONIN", "LACTICIDVEN"  Microbiology: No results found for this or any previous visit (from the past 240 hours).  Radiology Studies: No results found.  Scheduled Meds:  docusate sodium  100 mg Oral BID   enoxaparin  (LOVENOX ) injection  40 mg Subcutaneous Q24H   Continuous Infusions:  sodium chloride  125 mL/hr at 02/18/24 1645     LOS: 1 day   35 minutes with more than 50% spent in reviewing records, counseling patient/family and coordinating care.  Trenton Frock, MD [facility] [email or web address] 02/18/2024, 4:51 PM

## 2024-02-18 NOTE — Plan of Care (Signed)

## 2024-02-19 ENCOUNTER — Inpatient Hospital Stay (HOSPITAL_COMMUNITY)

## 2024-02-19 DIAGNOSIS — N179 Acute kidney failure, unspecified: Secondary | ICD-10-CM | POA: Diagnosis not present

## 2024-02-19 LAB — CBC WITH DIFFERENTIAL/PLATELET
Abs Immature Granulocytes: 0.02 10*3/uL (ref 0.00–0.07)
Basophils Absolute: 0 10*3/uL (ref 0.0–0.1)
Basophils Relative: 1 %
Eosinophils Absolute: 0 10*3/uL (ref 0.0–0.5)
Eosinophils Relative: 1 %
HCT: 31.8 % — ABNORMAL LOW (ref 36.0–46.0)
Hemoglobin: 10.5 g/dL — ABNORMAL LOW (ref 12.0–15.0)
Immature Granulocytes: 1 %
Lymphocytes Relative: 35 %
Lymphs Abs: 1.3 10*3/uL (ref 0.7–4.0)
MCH: 31.4 pg (ref 26.0–34.0)
MCHC: 33 g/dL (ref 30.0–36.0)
MCV: 95.2 fL (ref 80.0–100.0)
Monocytes Absolute: 0.6 10*3/uL (ref 0.1–1.0)
Monocytes Relative: 17 %
Neutro Abs: 1.8 10*3/uL (ref 1.7–7.7)
Neutrophils Relative %: 45 %
Platelets: 209 10*3/uL (ref 150–400)
RBC: 3.34 MIL/uL — ABNORMAL LOW (ref 3.87–5.11)
RDW: 11.9 % (ref 11.5–15.5)
WBC: 3.8 10*3/uL — ABNORMAL LOW (ref 4.0–10.5)
nRBC: 0 % (ref 0.0–0.2)

## 2024-02-19 LAB — BASIC METABOLIC PANEL WITH GFR
Anion gap: 9 (ref 5–15)
BUN: 9 mg/dL (ref 6–20)
CO2: 23 mmol/L (ref 22–32)
Calcium: 8.2 mg/dL — ABNORMAL LOW (ref 8.9–10.3)
Chloride: 105 mmol/L (ref 98–111)
Creatinine, Ser: 0.36 mg/dL — ABNORMAL LOW (ref 0.44–1.00)
GFR, Estimated: >60 mL/min (ref >60)
Glucose, Bld: 107 mg/dL — ABNORMAL HIGH (ref 70–99)
Potassium: 3.3 mmol/L — ABNORMAL LOW (ref 3.5–5.1)
Sodium: 137 mmol/L (ref 135–145)

## 2024-02-19 LAB — HEPATIC FUNCTION PANEL
ALT: 45 U/L — ABNORMAL HIGH (ref 0–44)
AST: 65 U/L — ABNORMAL HIGH (ref 15–41)
Albumin: 3.4 g/dL — ABNORMAL LOW (ref 3.5–5.0)
Alkaline Phosphatase: 56 U/L (ref 38–126)
Bilirubin, Direct: 0.1 mg/dL (ref 0.0–0.2)
Indirect Bilirubin: 0.9 mg/dL (ref 0.3–0.9)
Total Bilirubin: 1 mg/dL (ref 0.0–1.2)
Total Protein: 5.8 g/dL — ABNORMAL LOW (ref 6.5–8.1)

## 2024-02-19 LAB — TECHNOLOGIST SMEAR REVIEW: Plt Morphology: NORMAL

## 2024-02-19 LAB — LIPASE, BLOOD: Lipase: 152 U/L — ABNORMAL HIGH (ref 11–51)

## 2024-02-19 MED ORDER — VILAZODONE HCL 20 MG PO TABS
40.0000 mg | ORAL_TABLET | Freq: Every day | ORAL | Status: DC
  Administered 2024-02-19: 40 mg via ORAL
  Filled 2024-02-19: qty 2

## 2024-02-19 MED ORDER — POTASSIUM CHLORIDE CRYS ER 20 MEQ PO TBCR
40.0000 meq | EXTENDED_RELEASE_TABLET | Freq: Once | ORAL | Status: AC
  Administered 2024-02-19: 40 meq via ORAL
  Filled 2024-02-19: qty 2

## 2024-02-19 NOTE — Progress Notes (Incomplete)
 PROGRESS NOTE  Teresa Hensley  MVH:[id number] DOB: 1976/01/27 DOA: 02/17/2024 PCP: Gavin Kast, FNP  Consultants  Brief Narrative: 48 y.o. female with  PMH significant for ADHD, anxiety disorder, depression, history of parasitic infection presented in the ED with complaints of intractable nausea and vomiting for 4 days.  Patient denies any abdominal pain.  She denies any recent travel or eating outside,  states she started throwing up for last four days, She is not able to keep anything down. She denies any sick contacts.  Patient did reports going to [place] few months back for a treatment for parasitic infection in her face. She denies any fever, chills, cough, diarrhea. No further nausea or vomiting since admission.  Now complaining of facial pain, present for past 5 years.  MRI performed this AM which showed inclusion cyst in vallecula but otherwise negative.     Assessment & Plan: Concern for parasites: - this is far and away now the patient's main concern.   - reporting facial pain present for past 5 years and says today "this is main reason I came to hospital."  Patient reports history of parasitic infection in the past and been treated multiple times. Very perserverative on this.  Has MRI with her from [place] with report of possible linear infection Right eyelid and cheek.   Also with personal concern for filiarisis.  Will add peripheral smear and send skin scrapings for pathology.  - Fortunately WBC normal.  Will obtain AM diff to assess eosinophils.   Acute kidney injury: Likely prerenal in the setting of intractable nausea and vomiting. Baseline serum creatinine 0.7 ,  presented with creatinine 1.90. Downtrending. Avoid nephrotoxic medications. Continue IV hydration. Monitor serum creatinine.   Elevated lipase: Patient denies any abdominal pain but reports intractable nausea and vomiting. Continue IV Zofran  as needed for nausea and vomiting. Monitor serum lipase. Remains  high, recheck in AM.     Hypertension: Blood pressure remains slightly elevated.   Patient denies taking any blood pressure medication. Continue hydralazine as needed.   Anxiety disorder: Continue Xanax  0.5 mg nightly.   Hyponatremia: Likely due to dehydration in the setting of nausea and vomiting. Continue normal saline infusion.  Monitor serum sodium   Leukocytosis: Likely reactive.  There is no signs of infection. Resolved.   Hypophosphatemia: - resolved s/p treatment with K-phos  Anemia, normocytic:  - all cell lines dropping.    Elevated bilirubin: - **repeat pending       DVT prophylaxis:  enoxaparin  (LOVENOX ) injection 40 mg Start: 02/17/24 1200  Code Status:   Code Status: Full Code Level of care: Med-Surg Status is: Inpatient Dispo:  Likely able to DC home tomorrow if lipase trends downward and creatinine remains low    Subjective: Patient angry today that "nothing has been done for her" and that's she worse than when she came in.  Denies any abdominal pain, and now tells me that she never had abdominal pain and that it was her facial pain that brought her to the ER.  When asked about nausea/vomiting on admission, she said "that too."    Interrupted me when I told her that her MRI was negative and demanded a sinus swab to check for infection.  Also began demanding vancomycin and threatened to call 911 and record our conversation if she didn't either get antibiotics or a new doctor.    Objective: Vitals:   02/18/24 0528 02/18/24 1433 02/18/24 2106 02/19/24 0415  BP: 110/85 111/76 (!) 126/98 118/82  Pulse: 84 95 76 78  Resp: 17 18 17 18   Temp: 98.2 F (36.8 C) 98.9 F (37.2 C) 98.1 F (36.7 C) 98.4 F (36.9 C)  TempSrc: Oral   Oral  SpO2: 100% 100% 100% 99%  Weight:      Height:        Intake/Output Summary (Last 24 hours) at 02/19/2024 0838 Last data filed at 02/19/2024 0307 Gross per 24 hour  Intake 2118.78 ml  Output --  Net 2118.78 ml   Filed  Weights   02/17/24 1336  Weight: 60.2 kg   Body mass index is 21.42 kg/m.  Gen: 48 y.o. female in no apparent distress.  Nontoxic Pulm: Non-labored breathing.  Clear to auscultation bilaterally.  CV: Regular rate and rhythm. No murmur, rub, or gallop. No JVD GI: Abdomen soft, non-tender, non-distended, with normoactive bowel sounds.  Ext: Warm, no deformities, no pedal edema Skin: No rashes, lesions no ulcers Neuro: Alert and oriented. No focal neurological deficits. Psych: Calm.  Did become tearful when discussing her prior work-up for parasitosis and fact she has "never received treatment."  Verbose and difficult to redirect   I have personally reviewed the following labs and images: CBC: Recent Labs  Lab 02/17/24 0138 02/18/24 0804 02/19/24 0455  WBC 11.5* 4.9 3.8*  NEUTROABS  --   --  1.8  HGB 15.8* 11.5* 10.5*  HCT 44.7 33.3* 31.8*  MCV 90.9 93.8 95.2  PLT 427* 242 209   BMP &GFR Recent Labs  Lab 02/17/24 0138 02/18/24 0431 02/18/24 1547 02/19/24 0455  NA 129* 133* 136 137  K 3.6 3.0* 3.4* 3.3*  CL 91* 101 100 105  CO2 19* 22 27 23   GLUCOSE 185* 98 112* 107*  BUN 76* 20 15 9   CREATININE 1.90* 0.54 0.54 0.36*  CALCIUM 9.9 8.1* 8.7* 8.2*  MG  --  2.3  --   --   PHOS  --  1.4* 2.4*  --    Estimated Creatinine Clearance: 82.3 mL/min (A) (by C-G formula based on SCr of 0.36 mg/dL (L)). Liver & Pancreas: Recent Labs  Lab 02/17/24 0138 02/18/24 0431  AST 32 49*  ALT 37 30  ALKPHOS 84 47  BILITOT 2.5* 2.2*  PROT 9.3* 6.1*  ALBUMIN 5.8* 3.7   Recent Labs  Lab 02/17/24 0138 02/18/24 0431 02/18/24 1547 02/19/24 0455  LIPASE 123* 150* 179* 152*   No results for input(s): "AMMONIA" in the last 168 hours. Diabetic: No results for input(s): "HGBA1C" in the last 72 hours. No results for input(s): "GLUCAP" in the last 168 hours. Cardiac Enzymes: No results for input(s): "CKTOTAL", "CKMB", "CKMBINDEX", "TROPONINI" in the last 168 hours. No results for  input(s): "PROBNP" in the last 8760 hours. Coagulation Profile: No results for input(s): "INR", "PROTIME" in the last 168 hours. Thyroid  Function Tests: No results for input(s): "TSH", "T4TOTAL", "FREET4", "T3FREE", "THYROIDAB" in the last 72 hours. Lipid Profile: No results for input(s): "CHOL", "HDL", "LDLCALC", "TRIG", "CHOLHDL", "LDLDIRECT" in the last 72 hours. Anemia Panel: No results for input(s): "VITAMINB12", "FOLATE", "FERRITIN", "TIBC", "IRON", "RETICCTPCT" in the last 72 hours. Urine analysis:    Component Value Date/Time   COLORURINE AMBER (A) 02/17/2024 0534   APPEARANCEUR HAZY (A) 02/17/2024 0534   LABSPEC 1.023 02/17/2024 0534   PHURINE 5.0 02/17/2024 0534   GLUCOSEU NEGATIVE 02/17/2024 0534   HGBUR NEGATIVE 02/17/2024 0534   BILIRUBINUR NEGATIVE 02/17/2024 0534   KETONESUR 5 (A) 02/17/2024 0534   PROTEINUR 100 (A) 02/17/2024 0534  UROBILINOGEN 0.2 07/27/2013 1629   NITRITE NEGATIVE 02/17/2024 0534   LEUKOCYTESUR NEGATIVE 02/17/2024 0534   Sepsis Labs: Invalid input(s): "PROCALCITONIN", "LACTICIDVEN"  Microbiology: No results found for this or any previous visit (from the past 240 hours).  Radiology Studies: No results found.  Scheduled Meds:  docusate sodium  100 mg Oral BID   enoxaparin  (LOVENOX ) injection  40 mg Subcutaneous Q24H   potassium chloride   40 mEq Oral Once   Continuous Infusions:     LOS: 2 days   35 minutes with more than 50% spent in reviewing records, counseling patient/family and coordinating care.  Trenton Frock, MD [facility] [email or web address] 02/19/2024, 8:38 AM

## 2024-02-19 NOTE — Discharge Summary (Signed)
 Physician Discharge Summary   Patient: Teresa Hensley MRN: [id number] DOB: 17-Jan-1976  Admit date:     02/17/2024  Discharge date: 02/19/24  Discharge Physician: Trenton Frock   PCP: Gavin Kast, FNP   Recommendations at discharge:   Patient discharged with recommendation to follow-up with PCP in the next 3 to 5 days for recheck of blood counts.  Discharge Diagnoses: Principal Problem:   AKI (acute kidney injury) (HCC) Active Problems:   Psychiatric illness   ADHD (attention deficit hyperactivity disorder)   Anxiety disorder  Resolved Problems:   * No resolved hospital problems. *  Hospital Course: 48 y.o. female with  PMH significant for ADHD, anxiety disorder, depression, history of reported parasitic infection presented in the ED with complaints of intractable nausea and vomiting for 4 days.  Denied any abdominal pain.  Denied any recent travel or new foods.  Nausea vomiting quickly resolved after admission.  Patient was quickly rehydrated.  Did reveal that she has been to both [place] antibiotics recently for treatment of possible parasitic infection in her face.  Throughout hospital stay including hospital day 1 she downplayed her nausea and vomiting and focused extensively on concerns for parasitic infection.  Symptoms were fluid and ranged from facial pain and swelling to organisms crawling out of her skin to sensations of things crawling through her body.   Of note she reported that she has had facial pain for the past 5 years.  She did have a copy of prior MRI imaging studies done in [place]  [place].  She also had a copy of an extensive antiparasitic regimen including praziquantel and half a dozen other medications that she was on for 3 months to treat presumed parasitic infections.  She reported that this did not help her symptoms.   On hospital day 3/day of discharge she became very demanding of needing to treat a perceived staph infection in her sinuses.  She demanded  vancomycin for treatment.  MRI performed which showed inclusion cyst of the vallecula but otherwise was completely negative.  Rather than reassuring patient this actually upset her and she continued to demand vancomycin.  Due to her continued improvement --resolution of AKI, resolution of metabolic acidosis, resolution of nausea and vomiting -- she was discharged home with recommendations to follow-up with her PCP.     Assessment & Plan: Concern for parasites: - this was patient's main concern while in hospital.  New- - reporting facial pain present for past 5 years and says today "this is main reason I came to hospital."  Patient reports history of parasitic infection in the past and been treated multiple times, with no improvement. Very perserverative on this.  Has MRI with her from [place] with report of possible linear infection Right eyelid and cheek.  Because of this we repeated her MRI which was negative. -As noted above, rather than reassuring, patient became perseverative that she had not just parasitic infections but staph infection of her sinuses and demanded vancomycin -On exam patient's face not swollen and no redness of her sinuses, no tenderness.  No sinus drainage. -She also had skin scrapings which she did scrape from her back on the sink in her bathroom.  These appeared to be skin cells with no notable organisms present.  She notably had no rash concerning for skin infection -Also expressed personal concern for filariasis.  Peripheral blood smear was obtained which was negative   - Unfortunately white blood cell count was not elevated and eosinophils not present which  also help rule out likelihood of parasitic infection.     Acute kidney injury: Likely prerenal in the setting of intractable nausea and vomiting. Baseline serum creatinine 0.7 ,  presented with creatinine 1.90.  Completely resolved and actually low at discharge No further nausea or vomiting after admission.    Elevated lipase: Patient denies any abdominal pain but reported intractable nausea and vomiting on admission. Lipase remained elevated minimally.  Did not need Zofran  while in house, which was reassuring Elevated lipase by itself not specific for pancreatitis, especially in light of patient tolerating p.o. and no abdominal pain/tenderness on exam   Hypertension: Blood pressure remains slightly elevated after admission but resolved nicely.   Patient denies taking any blood pressure medication.   Anxiety disorder: Continue Xanax  0.5 mg nightly. Also on Viibryd outpatient.  This was held initially but restarted. - She has a great deal of anxiety around possible parasitic infections. 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 -Recommend close follow-up with PCP and consider psych referral for further evaluation and diagnosis   Hyponatremia: Likely due to dehydration in the setting of nausea and vomiting. Resolved   Leukocytosis: Likely reactive.  There is no signs of infection.  Resolved and no eosinophilia   Hypophosphatemia: - resolved s/p treatment with K-phos -Secondary to intractable nausea vomiting  Hypokalemia: - persistent throughout admission.  Replaced every day -Likely initially secondary to nausea vomiting and decreased overall potassium   Anemia, normocytic:  - all cell lines dropping after admission. - Patient also reports that she started her period today No other signs or symptoms of bleeding   Elevated bilirubin: - Repeat resolved - Had minimal transaminitis and recommendations for recheck CMET in 3 to 5 days to ensure resolution  Acute metabolic  acidosis: - present on admission, quickly resolved s/p fluid rehydration.       Disposition: Home Diet recommendation:  Discharge Diet Orders (From admission, onward)     Start     Ordered   02/19/24 0000  Diet general        02/19/24 1528           Regular diet DISCHARGE MEDICATION: Allergies as of 02/19/2024   No Known Allergies      Medication List     TAKE these medications    ALPRAZolam  0.5 MG tablet Commonly known as: XANAX  Take 0.5 mg by mouth daily as needed for anxiety.   amphetamine -dextroamphetamine  30 MG tablet Commonly known as: ADDERALL  Take 10 mg by mouth daily.   ibuprofen 200 MG tablet Commonly known as: ADVIL Take 400 mg by mouth every 6 (six) hours as needed for mild pain (pain score 1-3) or moderate pain (pain score 4-6).   Vilazodone HCl 40 MG Tabs Commonly known as: VIIBRYD Take 40 mg by mouth daily.        Discharge Exam: Filed Weights   02/17/24 1336  Weight: 60.2 kg   Gen: 47 y.o. female in no apparent distress.  Nontoxic HEENT:  [place]/AT.  Pupils equal and reactive to light bilaterally.  Nonicteric appearing conjunctiva.  No conjunctival injection.  No redness or swelling to face on exam.  Mouth with moist mucous membranes.  No lesions noted within mouth.  Neck: No lymphadenopathy.  No masses Pulm: Non-labored breathing.  Clear to auscultation bilaterally.  CV: Regular rate and rhythm.  GI: Abdomen soft, non-tender, non-distended Ext: Warm, no deformities, no pedal edema Skin: No rashes, lesions no ulcers.  Unable to appreciate any bugs or parasites or other organisms coming from skin Neuro: Alert and oriented. No focal neurological deficits. Psych: Did become tearful when discussing her prior work-up for parasitosis and fact she has "never received treatment."  Difficult to redirect.  Condition at discharge: good  The results of significant diagnostics from this hospitalization (including imaging, microbiology, ancillary and  laboratory) are listed below for reference.   Imaging Studies: MR ORBITS WO CONTRAST Result Date: 02/19/2024 CLINICAL DATA:  48 year old female with chronic right facial pain and swelling, possible parasitic infection in [place] ("outside MRI report reads foreign body right eyelid and right cheek" ). EXAM: MRI OF THE ORBITS WITHOUT CONTRAST TECHNIQUE: Multiplanar, multi-echo pulse sequences of the orbits and surrounding structures were acquired including fat saturation techniques, without intravenous contrast administration. COMPARISON:  Face CT 07/26/2021. MRI neck and face today reported separately. FINDINGS: Orbits: Normal suprasellar cistern. Optic chiasm appears normal. Pre chiasmatic optic nerves appear symmetric and normal. Noncontrast cavernous sinus appears symmetric and within normal limits. No evidence of intraorbital mass or inflammation. Extra-ocular muscles, lacrimal glands appear symmetric and normal. Globes appear symmetric and intact. No periorbital soft tissue abnormality identified. Eyelid regions and other preseptal orbit soft tissues appears symmetric and within normal limits. Visualized sinuses: Well aerated. Minimal paranasal sinus mucosal thickening. Soft tissues: Negative visible other superficial and deep soft tissue spaces of the face and scalp. Limited intracranial: Cerebral volume appears normal. No midline shift, mass effect, evidence of mass lesion, ventriculomegaly, or extra-axial collection. Cervicomedullary junction and pituitary are within normal limits. Visible gray and white matter signal appears normal. No encephalomalacia is identified. IMPRESSION: 1. Normal noncontrast MRI appearance of the Orbits. 2. MRI Neck and Face today reported separately. Electronically Signed   By: Marlise Simpers M.D.   On: 02/19/2024 09:04   MR NECK SOFT TISSUE ONLY WO CONTRAST Result Date: 02/19/2024 CLINICAL DATA:  48 year old female with chronic right facial pain and swelling, possible parasitic  infection in [place] ("outside MRI report reads foreign body right eyelid and right cheek"). EXAM: MRI OF THE NECK WITHOUT CONTRAST TECHNIQUE: Multiplanar, multisequence MR imaging of the neck was performed. No intravenous contrast was administered. COMPARISON:  MRI face and orbit today are reported separately. Prior face CT 07/26/2021. FINDINGS: Pharynx and larynx: Larynx, epiglottis contours are within normal limits. Small rounded/oval and T2 hyperintense asymmetry at the right vallecula (series 14 image 18 and image 19) is identified across this series of images today and most resembles small retention cyst. Furthermore, these are retrospectively identified on 07/26/2021 as small circumscribed areas of simple fluid density (series 3, images 14 and 15 of that exam) and appear unchanged. Other pharyngeal soft tissue contours and signal appear normal. Negative contrast parapharyngeal and retropharyngeal spaces. Salivary glands: Negative noncontrast sublingual space, submandibular spaces, parotid spaces. Noncontrast sublingual, submandibular, and parotid glands appear symmetric and within normal limits. Thyroid : Negative. Lymph nodes: Negative.  No cervical lymphadenopathy. Vascular: Preserved major vascular flow voids in the bilateral neck and at the skull base. The left vertebral artery appears mildly dominant (normal variant). Limited intracranial: Normal visible cerebral volume. No evidence of intracranial mass effect or ventriculomegaly. Visible gray and white matter signal grossly normal. Visualized orbits: Reported separately. Mastoids and visualized paranasal sinuses: Well aerated. Minimal  paranasal sinus mucosal thickening. Mastoids appear clear. 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 But background bone marrow signal is normal. No other marrow heterogeneity identified. No marrow edema  or evidence of acute osseous abnormality. Upper chest: Negative. IMPRESSION: 1. No acute or inflammatory process identified in the noncontrast Neck. 2. 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 3. 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 No follow-up imaging recommended. 4. MRI Face and Orbits today are reported separately. Electronically Signed   By: Marlise Simpers M.D.   On: 02/19/2024 09:02   MR FACE/TRIGEMINAL WO CM Result Date: 02/19/2024 CLINICAL DATA:  48 year old female with chronic right facial pain and swelling, possible parasitic infection in [place] ("outside MRI report reads foreign body right eyelid and right cheek"). EXAM: MRI FACE TRIGEMINAL WITHOUT CONTRAST TECHNIQUE: Multiplanar, multi-echo pulse sequences of the face and surrounding structures, including thin-slice imaging of the trigeminal nerves, were acquired without intravenous contrast administration. COMPARISON:  MRI orbits and neck the same day reported separately. Prior face CT 07/26/2021. FINDINGS: Limited intracranial/Trigeminal nerves: Noncontrast brainstem signal and morphology appear normal. Patent basilar cisterns. Fairly symmetric and normal appearance of the bilateral 5th nerve cisternal segments (series 8, images 23 and 25). Symmetric noncontrast bilateral Meckel's cave, cavernous sinus. Grossly symmetric visible noncontrast V2 and V3 trunks, infraorbital nerves, inferior alveolar nerves. Additionally, no evidence of intracranial mass effect or ventriculomegaly. Other visible gray and white matter signal is grossly normal. Unremarkable pituitary. Vascular: Major vascular flow voids in the visible face and at the skull base appear preserved. Sinuses/Orbits: Orbits are detailed separately. Minimal paranasal sinus mucosal thickening. Soft tissues: Bilateral superficial and deep soft tissue spaces of the face appear symmetric and within normal limits on this noncontrast exam. No  soft tissue swelling, edema, inflammation identified. No convincing foreign body. Osseous: Visualized bone marrow signal is within normal limits. No marrow edema or evidence of acute osseous abnormality. Negative visible cervical spine. Other: No upper cervical lymphadenopathy, neck MRI is reported separately. IMPRESSION: Normal noncontrast MRI appearance of the Face, trigeminal nerves. Orbit and neck MRI are reported separately. Electronically Signed   By: Marlise Simpers M.D.   On: 02/19/2024 08:57   CT ABDOMEN PELVIS WO CONTRAST Result Date: 02/17/2024 CLINICAL DATA:  Abdominal pain, acute, nonlocalized. Nausea and vomiting for 4 days. EXAM: CT ABDOMEN AND PELVIS WITHOUT CONTRAST TECHNIQUE: Multidetector CT imaging of the abdomen and pelvis was performed following the standard protocol without IV contrast. RADIATION DOSE REDUCTION: This exam was performed according to the departmental dose-optimization program which includes automated exposure control, adjustment of the mA and/or kV according to patient size and/or use of iterative reconstruction technique. COMPARISON:  Right upper quadrant ultrasound 09/10/2018 FINDINGS: Lower chest: The lung bases are clear without focal nodule, mass, or airspace disease. The heart size is normal. No significant pleural or pericardial effusion present. Hepatobiliary: Diffuse fatty infiltration of the liver is present. There is some sparing along the falciform ligament. The common bile duct and gallbladder are. Pancreas: Unremarkable. No pancreatic ductal dilatation or surrounding inflammatory changes. Spleen: Normal in size without focal abnormality. Adrenals/Urinary Tract: Adrenal glands are normal bilaterally. Kidneys are unremarkable. No stone or obstruction is present. The ureters are within normal limits. The urinary bladder is normal, mostly collapsed. Stomach/Bowel: Stomach and duodenum within normal limits. Small bowel is unremarkable. The terminal ileum is normal. 11 mm  appendicolith  is present. The appendix is normal. The ascending and transverse colon are within normal limits. The descending and sigmoid colon are normal. Vascular/Lymphatic: Minimal atherosclerotic changes are present in the aorta and branch vessels. No aneurysm is present. No significant adenopathy is present. Reproductive: An anterior subserosal fibroid near the fundus measures 6.4 x 6.4 x 5.6 cm. Uterus and adnexa are otherwise within. Other: None Musculoskeletal: Vertebral body heights and alignment are normal. No focal osseous lesions are present. Bony pelvis is normal. Hips are located and within normal limits bilaterally. IMPRESSION: 1. Hepatic steatosis. 2. 11 mm appendicolith without associated appendicitis. 3. Anterior subserosal fibroid near the fundus measures 6.4 x 6.4 x 5.6 cm. 4. No acute or other focal lesion to explain the patient's symptoms. Electronically Signed   By: Audree Leas M.D.   On: 02/17/2024 09:04    Microbiology: No results found for this or any previous visit.  Labs: CBC: Recent Labs  Lab 02/17/24 0138 02/18/24 0804 02/19/24 0455  WBC 11.5* 4.9 3.8*  NEUTROABS  --   --  1.8  HGB 15.8* 11.5* 10.5*  HCT 44.7 33.3* 31.8*  MCV 90.9 93.8 95.2  PLT 427* 242 209   Basic Metabolic Panel: Recent Labs  Lab 02/17/24 0138 02/18/24 0431 02/18/24 1547 02/19/24 0455  NA 129* 133* 136 137  K 3.6 3.0* 3.4* 3.3*  CL 91* 101 100 105  CO2 19* 22 27 23   GLUCOSE 185* 98 112* 107*  BUN 76* 20 15 9   CREATININE 1.90* 0.54 0.54 0.36*  CALCIUM 9.9 8.1* 8.7* 8.2*  MG  --  2.3  --   --   PHOS  --  1.4* 2.4*  --    Liver Function Tests: Recent Labs  Lab 02/17/24 0138 02/18/24 0431 02/19/24 0455  AST 32 49* 65*  ALT 37 30 45*  ALKPHOS 84 47 56  BILITOT 2.5* 2.2* 1.0  PROT 9.3* 6.1* 5.8*  ALBUMIN 5.8* 3.7 3.4*   CBG: No results for input(s): "GLUCAP" in the last 168 hours.  Discharge time spent: less than 30 minutes.  Signed: Trenton Frock,  MD [facility] 02/19/2024

## 2024-02-19 NOTE — Plan of Care (Signed)

## 2024-02-19 NOTE — Progress Notes (Signed)
 See new order

## 2024-02-19 NOTE — Plan of Care (Signed)

## 2024-02-19 NOTE — Progress Notes (Signed)
 Patient discharged home; home medications obtained from the pharmacy and returned to patient; patient states that she is not satisfied with hospital outcomes; MD and [facility] of [facility] were made aware; patient provided information on how to express her concerns through patient experience; patient was escorted to the hospital entrance; no complications noted

## 2024-02-19 NOTE — Progress Notes (Signed)
 Paged provider  patient reports she needs to take her Vidryd 40 mg po every day antidepressant, are we holding that for her current treatment or can she restart please and thanks?    Awaiting response

## 2024-02-19 NOTE — Progress Notes (Signed)
 Pt stated face pain of 10/10 but only wants Tylenol  instead of Oxy due to constipation issues.  Pt then stated that she is 'leaking' rectally.  Pt said she wants ABX but as I explained to her that ABX only works on bacteria and not viruses/parasitics.  Pt is upset and frustrated by not getting ABX.  Pt now presents with a rash on the bilateral poster of her arms.  Pt stated it is due to the 'plastic' of the bandages.  Asked if pt wanted some benadryl for the rash and pt refused.
